# Patient Record
Sex: Female | Born: 1985 | Race: White | Hispanic: No | Marital: Married | Smoking: Former smoker
Health system: Southern US, Community
[De-identification: ages and names within clinical notes are randomized; demographics above are authoritative.]

## PROBLEM LIST (undated history)

## (undated) DIAGNOSIS — N10 Acute tubulo-interstitial nephritis: Secondary | ICD-10-CM

## (undated) DIAGNOSIS — R109 Unspecified abdominal pain: Secondary | ICD-10-CM

## (undated) DIAGNOSIS — N1330 Unspecified hydronephrosis: Secondary | ICD-10-CM

## (undated) DIAGNOSIS — N920 Excessive and frequent menstruation with regular cycle: Secondary | ICD-10-CM

## (undated) DIAGNOSIS — F329 Major depressive disorder, single episode, unspecified: Secondary | ICD-10-CM

## (undated) HISTORY — PX: WISDOM TOOTH EXTRACTION: SHX21

## (undated) HISTORY — DX: Excessive and frequent menstruation with regular cycle: N92.0

## (undated) HISTORY — DX: Unspecified abdominal pain: R10.9

## (undated) HISTORY — DX: Acute pyelonephritis: N10

## (undated) HISTORY — DX: Major depressive disorder, single episode, unspecified: F32.9

## (undated) HISTORY — PX: APPENDECTOMY: SHX54

## (undated) HISTORY — DX: Unspecified hydronephrosis: N13.30

---
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
## 2005-01-17 ENCOUNTER — Ambulatory Visit: Payer: Self-pay | Admitting: Family Medicine

## 2005-02-04 ENCOUNTER — Ambulatory Visit: Payer: Self-pay | Admitting: Family Medicine

## 2005-02-26 ENCOUNTER — Ambulatory Visit: Payer: Self-pay | Admitting: Family Medicine

## 2005-05-15 ENCOUNTER — Ambulatory Visit: Payer: Self-pay | Admitting: Family Medicine

## 2005-08-09 ENCOUNTER — Ambulatory Visit: Payer: Self-pay | Admitting: Family Medicine

## 2005-08-19 ENCOUNTER — Ambulatory Visit: Payer: Self-pay | Admitting: Family Medicine

## 2005-09-10 ENCOUNTER — Ambulatory Visit: Payer: Self-pay | Admitting: Family Medicine

## 2005-09-12 ENCOUNTER — Ambulatory Visit: Payer: Self-pay | Admitting: Family Medicine

## 2005-11-20 ENCOUNTER — Ambulatory Visit: Payer: Self-pay | Admitting: Family Medicine

## 2005-12-17 DIAGNOSIS — N10 Acute tubulo-interstitial nephritis: Secondary | ICD-10-CM | POA: Insufficient documentation

## 2005-12-17 HISTORY — DX: Acute pyelonephritis: N10

## 2006-01-24 ENCOUNTER — Encounter: Payer: Self-pay | Admitting: Family Medicine

## 2006-04-18 ENCOUNTER — Other Ambulatory Visit: Admission: RE | Admit: 2006-04-18 | Discharge: 2006-04-18 | Payer: Self-pay | Admitting: Family Medicine

## 2006-04-18 ENCOUNTER — Encounter: Payer: Self-pay | Admitting: Family Medicine

## 2006-04-18 ENCOUNTER — Encounter (INDEPENDENT_AMBULATORY_CARE_PROVIDER_SITE_OTHER): Payer: Self-pay | Admitting: *Deleted

## 2006-04-18 ENCOUNTER — Ambulatory Visit: Payer: Self-pay | Admitting: Family Medicine

## 2006-04-18 DIAGNOSIS — R109 Unspecified abdominal pain: Secondary | ICD-10-CM

## 2006-04-18 HISTORY — DX: Unspecified abdominal pain: R10.9

## 2006-04-18 LAB — CONVERTED CEMR LAB
Bilirubin Urine: NEGATIVE
Glucose, Urine, Semiquant: NEGATIVE
Urobilinogen, UA: NEGATIVE

## 2006-04-23 ENCOUNTER — Telehealth: Payer: Self-pay | Admitting: Family Medicine

## 2006-08-01 ENCOUNTER — Ambulatory Visit: Payer: Self-pay | Admitting: Family Medicine

## 2006-08-01 DIAGNOSIS — R42 Dizziness and giddiness: Secondary | ICD-10-CM | POA: Insufficient documentation

## 2006-08-01 DIAGNOSIS — R55 Syncope and collapse: Secondary | ICD-10-CM

## 2006-08-01 LAB — CONVERTED CEMR LAB
Blood Glucose, Fasting: 96 mg/dL
Hgb A1c MFr Bld: 5.3 %

## 2006-08-05 LAB — CONVERTED CEMR LAB
ALT: 10 units/L (ref 0–35)
AST: 16 units/L (ref 0–37)
Albumin: 4.6 g/dL (ref 3.5–5.2)
BUN: 11 mg/dL (ref 6–23)
Calcium: 9.4 mg/dL (ref 8.4–10.5)
Chloride: 108 meq/L (ref 96–112)
Hemoglobin: 13.1 g/dL (ref 12.0–15.0)
Potassium: 4.1 meq/L (ref 3.5–5.3)
RBC: 4.31 M/uL (ref 3.87–5.11)
RDW: 13.2 % (ref 11.5–14.0)

## 2006-09-22 ENCOUNTER — Ambulatory Visit: Payer: Self-pay | Admitting: Family Medicine

## 2006-09-22 ENCOUNTER — Encounter: Admission: RE | Admit: 2006-09-22 | Discharge: 2006-09-22 | Payer: Self-pay | Admitting: Family Medicine

## 2006-09-22 DIAGNOSIS — N63 Unspecified lump in unspecified breast: Secondary | ICD-10-CM | POA: Insufficient documentation

## 2006-10-23 ENCOUNTER — Ambulatory Visit: Payer: Self-pay | Admitting: Family Medicine

## 2006-10-23 DIAGNOSIS — N949 Unspecified condition associated with female genital organs and menstrual cycle: Secondary | ICD-10-CM

## 2007-07-14 ENCOUNTER — Encounter: Payer: Self-pay | Admitting: Family Medicine

## 2007-07-14 ENCOUNTER — Other Ambulatory Visit: Admission: RE | Admit: 2007-07-14 | Discharge: 2007-07-14 | Payer: Self-pay | Admitting: Family Medicine

## 2007-07-14 ENCOUNTER — Ambulatory Visit: Payer: Self-pay | Admitting: Family Medicine

## 2007-07-14 DIAGNOSIS — N760 Acute vaginitis: Secondary | ICD-10-CM | POA: Insufficient documentation

## 2007-07-15 ENCOUNTER — Encounter: Payer: Self-pay | Admitting: Family Medicine

## 2007-07-15 LAB — CONVERTED CEMR LAB: Yeast Wet Prep HPF POC: NONE SEEN

## 2007-09-30 ENCOUNTER — Telehealth: Payer: Self-pay | Admitting: Family Medicine

## 2007-10-15 ENCOUNTER — Ambulatory Visit: Payer: Self-pay | Admitting: Family Medicine

## 2007-10-15 DIAGNOSIS — F329 Major depressive disorder, single episode, unspecified: Secondary | ICD-10-CM

## 2007-10-15 DIAGNOSIS — F3289 Other specified depressive episodes: Secondary | ICD-10-CM

## 2007-10-15 HISTORY — DX: Major depressive disorder, single episode, unspecified: F32.9

## 2007-10-15 HISTORY — DX: Other specified depressive episodes: F32.89

## 2007-11-17 ENCOUNTER — Ambulatory Visit: Payer: Self-pay | Admitting: Family Medicine

## 2007-11-17 DIAGNOSIS — T23139A Burn of first degree of unspecified multiple fingers (nail), not including thumb, initial encounter: Secondary | ICD-10-CM

## 2008-02-01 ENCOUNTER — Ambulatory Visit: Payer: Self-pay | Admitting: Family Medicine

## 2008-02-19 ENCOUNTER — Ambulatory Visit: Payer: Self-pay | Admitting: Family Medicine

## 2008-02-19 DIAGNOSIS — M6740 Ganglion, unspecified site: Secondary | ICD-10-CM | POA: Insufficient documentation

## 2008-03-22 ENCOUNTER — Ambulatory Visit: Payer: Self-pay | Admitting: Family Medicine

## 2008-09-05 ENCOUNTER — Ambulatory Visit: Payer: Self-pay | Admitting: Occupational Medicine

## 2008-09-05 LAB — CONVERTED CEMR LAB
Beta hcg, urine, semiquantitative: NEGATIVE
Bilirubin Urine: NEGATIVE
Glucose, Urine, Semiquant: NEGATIVE
Protein, U semiquant: 100
pH: 7.5

## 2008-12-01 ENCOUNTER — Ambulatory Visit: Payer: Self-pay | Admitting: Family Medicine

## 2008-12-01 DIAGNOSIS — J019 Acute sinusitis, unspecified: Secondary | ICD-10-CM

## 2009-10-26 ENCOUNTER — Ambulatory Visit: Payer: Self-pay | Admitting: Family Medicine

## 2009-10-26 DIAGNOSIS — F411 Generalized anxiety disorder: Secondary | ICD-10-CM | POA: Insufficient documentation

## 2009-10-26 DIAGNOSIS — N920 Excessive and frequent menstruation with regular cycle: Secondary | ICD-10-CM

## 2009-10-26 HISTORY — DX: Excessive and frequent menstruation with regular cycle: N92.0

## 2009-10-27 ENCOUNTER — Encounter: Payer: Self-pay | Admitting: Family Medicine

## 2009-10-27 LAB — CONVERTED CEMR LAB
Chlamydia, DNA Probe: NEGATIVE
GC Probe Amp, Genital: NEGATIVE

## 2009-11-02 ENCOUNTER — Encounter: Payer: Self-pay | Admitting: Family Medicine

## 2010-04-12 NOTE — Assessment & Plan Note (Signed)
Summary: Followup Call  Followup call to patient [phone]);  no response, left message.  Recommend that she return for follow-up to see me on 11/06/09 or 11/07/09. Donna Christen MD  November 02, 2009 7:09 PM

## 2010-04-12 NOTE — Assessment & Plan Note (Signed)
Summary: ABNORMAL MENSTRAL BLEEDING (room5)   Vital Signs:  Patient Profile:   26 Years Old Female CC:      Heavy menses x 9 days, usually 3 day period LMP:     10/18/2009 Height:     65.5 inches Weight:      148 pounds O2 Sat:      100 % O2 treatment:    Room Air Temp:     98.8 degrees F oral Pulse rate:   72 / minute Pulse rhythm:   regular Resp:     12 per minute BP sitting:   113 / 76  (left arm) Cuff size:   regular  Vitals Entered By: Emilio Math (October 26, 2009 1:06 PM)  Menstrual History: LMP (date): 10/18/2009 LMP - Character: heavy                  Current Allergies (reviewed today): No known allergies History of Present Illness Chief Complaint: Heavy menses x 9 days, usually 3 day period History of Present Illness: Subjective:  Patient complains of two problems: 1)  Her present menstrual period has how lasted 9 days (normally about 3 days).  She denies abdominal pain but is continuing to have abdominal cramps.  Present period was on time.  She has had no preceding vaginal discharge.  No urinary symptoms.  No nausea/vomiting.  No fevers, chills, and sweats.  She is not presently using birth control of any type. 2)  She complains of increased stress for about 4 months.  She has been irritable and short-tempered.  She has difficulty falling asleep and complains of early morning awakening.  She has been crying more frequently but does not feel depressed.  No suicidal thoughts.  In the past for similar symptoms she had poor response from fluoxetine, sertraline, paroxetine, and bupropion.  REVIEW OF SYSTEMS Constitutional Symptoms      Denies fever, chills, night sweats, weight loss, weight gain, and fatigue.  Eyes       Denies change in vision, eye pain, eye discharge, glasses, contact lenses, and eye surgery. Ear/Nose/Throat/Mouth       Denies hearing loss/aids, change in hearing, ear pain, ear discharge, dizziness, frequent runny nose, frequent nose bleeds,  sinus problems, sore throat, hoarseness, and tooth pain or bleeding.  Respiratory       Denies dry cough, productive cough, wheezing, shortness of breath, asthma, bronchitis, and emphysema/COPD.  Cardiovascular       Complains of tires easily with exhertion.      Denies murmurs and chest pain.    Gastrointestinal       Complains of stomach pain.      Denies nausea/vomiting, diarrhea, constipation, blood in bowel movements, and indigestion. Genitourniary       Complains of blood or discharge from vagina.      Denies painful urination, kidney stones, and loss of urinary control. Neurological       Complains of headaches.      Denies paralysis, seizures, and fainting/blackouts. Musculoskeletal       Denies muscle pain, joint pain, joint stiffness, decreased range of motion, redness, swelling, muscle weakness, and gout.  Skin       Denies bruising, unusual mles/lumps or sores, and hair/skin or nail changes.  Psych       Complains of mood changes and anxiety/stress.      Denies temper/anger issues, speech problems, depression, and sleep problems.  Past History:  Past Medical History: Reviewed history from 01/24/2006 and no changes  required. G1P1001 NSVD term female  genital molluscum contagiosum  hospitalized for Pyelonephritis 6-07  Past Surgical History: Reviewed history from 09/05/2008 and no changes required. Denies surgical history  Family History: Reviewed history from 12/17/2005 and no changes required. mother father 2 brothers alive and healthy  Social History: Reviewed history from 12/17/2005 and no changes required. Graduated from HS.  Lives with boyfriend (FOB) and infant son.  Parents in the area.  Smokes 1/2 ppd x 3 yrs.  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 Pharmacy Tech   Objective:  Appearance:  Patient appears healthy, stated age, and in no acute distress  Psychiatric:  Patient is alert and oriented with good eye contact.  Thoughts are organized.  No psychomotor  retardation.  Good eye contact.  Memory intact.  Not suicidal.  Euthymic mood and affect Eyes:  Pupils are equal, round, and reactive to light and accomdation.  Extraocular movement is intact.  Conjunctivae are not inflamed.  Pharynx:  Normal  Neck:  Supple.  No adenopathy is present.  No thyromegaly is present  Lungs:  Clear to auscultation.  Breath sounds are equal.  Heart:  Regular rate and rhythm without murmurs, rubs, or gallops.  Abdomen:  Nontender without masses or hepatosplenomegaly.  Bowel sounds are present.  No CVA or flank tenderness.  Genitourinary:  Vulva appears normal without lesions or erythema.  Vagina has normal  mucosae without lesions and there is no discharge present in the vaginal vault.  Cervix appears normal without lesions.  There is menstrual blood in the cervical os.   No cervical motion tenderness present.    Uterus is small and nontender.  Adnexae are nontender without massess.  Wet prep and KOH prep negative urinalysis (dipstick): negative Urine pregnancy test negative Assessment New Problems: ANXIETY (ICD-300.00) MENORRHAGIA (ICD-626.2)  SUSPECT ANOVULATORY CYCLE  Plan New Medications/Changes: BUSPIRONE HCL 5 MG TABS (BUSPIRONE HCL) One by mouth two times a day  #30 x 0, 10/26/2009, Donna Christen MD NAPROXEN 500 MG TABS (NAPROXEN) One by mouth two times a day pc  #14 x 1, 10/26/2009, Donna Christen MD VENLAFAXINE HCL 25 MG TABS (VENLAFAXINE HCL) One by mouth two times a day  #30 x 0, 10/26/2009, Donna Christen MD  New Orders: Est. Patient Level III [16109] Urinalysis [81003-65000] Urine Pregnancy [CPT-81025] Burnett Corrente [facility] [[id number]] Planning Comments:   Reassurance.  Begin Naproxen 500mg  Q12hr for 3 to  5 days, then discontinue.  If menorrhagia persists, follow-up with GYN. Begin a trial of buspirone 5mg  two times a day (Rx #30).  Return in 10 to 14 days for follow-up (titrate dose if necessary); will then need to follow-up with her PCP for continued  management.   Return for worsening symptoms   The patient and/or caregiver has been counseled thoroughly with regard to medications prescribed including dosage, schedule, interactions, rationale for use, and possible side effects and they verbalize understanding.  Diagnoses and expected course of recovery discussed and will return if not improved as expected or if the condition worsens. Patient and/or caregiver verbalized understanding.  Prescriptions: BUSPIRONE HCL 5 MG TABS (BUSPIRONE HCL) One by mouth two times a day  #30 x 0   Entered and Authorized by:   Donna Christen MD   Signed by:   Donna Christen MD on 10/26/2009   Method used:   Print then Give to Patient   RxID:   [id number] NAPROXEN 500 MG TABS (NAPROXEN) One by mouth two times a day pc  #14 x 1  Entered and Authorized by:   Donna Christen MD   Signed by:   Donna Christen MD on 10/26/2009   Method used:   Print then Give to Patient   RxID:   [id number] VENLAFAXINE HCL 25 MG TABS (VENLAFAXINE HCL) One by mouth two times a day  #30 x 0   Entered and Authorized by:   Donna Christen MD   Signed by:   Donna Christen MD on 10/26/2009   Method used:   Print then Give to Patient   RxID:   [id number]   Orders Added: 1)  Est. Patient Level III [84132] 2)  Urinalysis [81003-65000] 3)  Urine Pregnancy [CPT-81025] 4)  KOH/ WET [place] [[id number]]  Appended Document: ABNORMAL MENSTRAL BLEEDING (room5) UA: GLU: Neg BIL: Neg KET: Neg SG 1.025 BLO 2+ pH 6.0 PRO Neg URO: 0.2 NIT: Neg LEU: Neg  Pregnancy: Neg

## 2010-04-12 NOTE — Assessment & Plan Note (Signed)
Summary: GC/Chlamydia  Plan:   Additional testing for today's office visit:  GC and chlamydia sent. Donna Christen MD  October 26, 2009 3:11 PM

## 2012-01-03 ENCOUNTER — Ambulatory Visit (INDEPENDENT_AMBULATORY_CARE_PROVIDER_SITE_OTHER): Payer: Self-pay | Admitting: Family Medicine

## 2012-01-03 ENCOUNTER — Encounter: Payer: Self-pay | Admitting: Family Medicine

## 2012-01-03 VITALS — BP 115/52 | HR 68 | Temp 98.1°F | Wt 168.0 lb

## 2012-01-03 DIAGNOSIS — R300 Dysuria: Secondary | ICD-10-CM

## 2012-01-03 DIAGNOSIS — N390 Urinary tract infection, site not specified: Secondary | ICD-10-CM

## 2012-01-03 LAB — POCT URINALYSIS DIPSTICK
Nitrite, UA: NEGATIVE
Protein, UA: NEGATIVE
pH, UA: 5.5

## 2012-01-03 MED ORDER — CIPROFLOXACIN HCL 250 MG PO TABS: ORAL_TABLET | ORAL | Status: AC

## 2012-01-03 NOTE — Progress Notes (Signed)
CC: Caitlin Jordan is a 26 y.o. female is here for Urinary Tract Infection   Subjective: HPI:  Patient tells me she woke up this morning with an extreme sense of urinary urgency, when she attempted to urinate there was a burning sensation near her urethra and she only had a small amount of urine to pass. She's had these symptoms since this morning, they're not getting any better or any worse, she is able to pass urine but is always in small volumes. She says this feels identical to past urinary tract infections however is been over a year since she's had her last one. She denies change in color or odor or consistency of her urine. She denies any other genitourinary complaints. She denies fevers, chills, nausea, vomiting, abdominal pain, GI disturbance, nor flank pain. She has no history of kidney stones. She tells me she was a urology office last week for followup of hydronephrosis in her recent pregnancy and she was told the urinalysis looked great and that a x-ray of her kidneys showed no abnormality.   Review Of Systems Outlined In HPI  Past Medical History  Diagnosis Date  . PELVIC  PAIN 04/18/2006    Qualifier: Diagnosis of  By: Linford Arnold MD, Santina Evans    . MENORRHAGIA 10/26/2009    Qualifier: Diagnosis of  By: Cathren Harsh MD, Jeannett Senior    . PYELONEPHRITIS, ACUTE 12/17/2005    Qualifier: Diagnosis of  By: Jerrol Banana    . DEPRESSION 10/15/2007    Qualifier: Diagnosis of  By: Linford Arnold MD, Santina Evans       History reviewed. No pertinent family history.   History  Substance Use Topics  . Smoking status: Former Smoker    Types: Cigarettes    Quit date: 11/03/2011  . Smokeless tobacco: Not on file  . Alcohol Use: 1.5 - 3.0 oz/week    3-6 drink(s) per week     Objective: Filed Vitals:   01/03/12 0925  BP: 115/52  Pulse: 68  Temp: 98.1 F (36.7 C)    General: Alert and Oriented, No Acute Distress HEENT: Pupils equal, round, reactive to light. Conjunctivae clear.  Moist mucous  membranes Lungs: Comfortable work of breathing  Cardiac: Regular rate and rhythm  Abdomen: Soft nontender  Back: No CVA tenderness   Assessment & Plan: Zamyia was seen today for urinary tract infection.  Diagnoses and associated orders for this visit:  Dysuria - Urinalysis Dipstick  Uti (lower urinary tract infection) - Urine culture - ciprofloxacin (CIPRO) 250 MG tablet; Take one by mouth twice a day for five days.    Discussed local sensitivities to Bactrim and Cipro, she has had success with Cipro in the past and preferred this regimen.Signs and symptoms requring emergent/urgent reevaluation were discussed with the patient.  Return in about 4 weeks (around 01/31/2012) for Establish care with Dr. Judie Petit.

## 2012-01-29 ENCOUNTER — Ambulatory Visit (INDEPENDENT_AMBULATORY_CARE_PROVIDER_SITE_OTHER): Payer: Self-pay | Admitting: Family Medicine

## 2012-01-29 VITALS — Temp 98.0°F

## 2012-01-29 DIAGNOSIS — Z23 Encounter for immunization: Secondary | ICD-10-CM

## 2012-01-29 NOTE — Progress Notes (Signed)
  Subjective:    Patient ID: Caitlin Jordan, female    DOB: 1986/01/11, 26 y.o.   MRN: [id number]  HPI Flu shot   Review of Systems     Objective:   Physical Exam        Assessment & Plan:

## 2012-03-11 NOTE — L&D Delivery Note (Signed)
Delivery Note Admitted at [redacted]w[redacted]d in active labor. Pt adamant to have a waterbirth despite being informed multiple times previously, and again today by me, that waterbirth is contraindicated in preterm infants and that we strongly recommend not having a waterbirth due to potential complications related to prematurity. Pt understands risks and desired to proceed with waterbirth. She progressed quickly and at 11:13 AM a viable female was delivered via Vaginal, Spontaneous Delivery (Presentation:Right Occiput Anterior) underwater, and was brought directly above surface of water and placed skin-to-skin against mom's chest. Infant w/ spontaneous cry/respiration, strong lusty cry w/ stimulation. Cord clamped x 2 and cut by fob at ~87mins after birth, and infant taken to radiant warmer to dry off while mom got out of tub to birth placenta.  APGAR: 7, 7; weight 8 lb 11.2 oz (3945 g).   Placenta status: Intact, Spontaneous, duncan.  Cord: 3 vessels with the following complications: None.    At ~ of life while skin-to-skin on mom's chest while she was in bed, O2 sats were low and infant was taken to nursery for further evaluation.  Anesthesia: None  Episiotomy: None Lacerations: Labial Suture Repair: n/a Est. Blood Loss (mL): 350 initially, no prophylactic pitocin given initially, then pt had large gush of blood- 20mu pitocin given IM w/ quick resolution of bleeding. ~30 mins later ~7cm clot expressed. Total EBL . Pt asymptomatic, VS stable.   Mom to postpartum.  Baby to nursery for further observation, accompanied by RN and fob.  Marge Duncans 11/21/2012, 11:58 AM

## 2012-03-11 NOTE — L&D Delivery Note (Signed)
Attestation of Attending Supervision of Advanced Practitioner (PA/CNM/NP): Evaluation and management procedures were performed by the Advanced Practitioner under my supervision and collaboration.  I have reviewed the Advanced Practitioner's note and chart, and I agree with the management and plan.  Zenya Hickam, MD, FACOG Attending Obstetrician & Gynecologist Faculty Practice, [facility]  

## 2012-06-11 ENCOUNTER — Encounter (HOSPITAL_COMMUNITY): Payer: Self-pay | Admitting: Advanced Practice Midwife

## 2012-06-11 ENCOUNTER — Ambulatory Visit (INDEPENDENT_AMBULATORY_CARE_PROVIDER_SITE_OTHER): Payer: BC Managed Care – PPO | Admitting: Advanced Practice Midwife

## 2012-06-11 ENCOUNTER — Encounter: Payer: Self-pay | Admitting: Advanced Practice Midwife

## 2012-06-11 VITALS — BP 136/69 | Wt 172.0 lb

## 2012-06-11 DIAGNOSIS — Z3480 Encounter for supervision of other normal pregnancy, unspecified trimester: Secondary | ICD-10-CM | POA: Insufficient documentation

## 2012-06-11 DIAGNOSIS — Z3481 Encounter for supervision of other normal pregnancy, first trimester: Secondary | ICD-10-CM

## 2012-06-11 NOTE — Progress Notes (Signed)
p-76  Last pap smear post partum of last child and denies any abnormals

## 2012-06-11 NOTE — Patient Instructions (Signed)
Pregnancy - Second Trimester The second trimester of pregnancy (3 to 6 months) is a period of rapid growth for you and your baby. At the end of the sixth month, your baby is about 9 inches long and weighs 1 1/2 pounds. You will begin to feel the baby move between 18 and 20 weeks of the pregnancy. This is called quickening. Weight gain is faster. A clear fluid (colostrum) may leak out of your breasts. You may feel small contractions of the womb (uterus). This is known as false labor or Braxton-Hicks contractions. This is like a practice for labor when the baby is ready to be born. Usually, the problems with morning sickness have usually passed by the end of your first trimester. Some women develop small dark blotches (called cholasma, mask of pregnancy) on their face that usually goes away after the baby is born. Exposure to the sun makes the blotches worse. Acne may also develop in some pregnant women and pregnant women who have acne, may find that it goes away. PRENATAL EXAMS  Blood work may continue to be done during prenatal exams. These tests are done to check on your health and the probable health of your baby. Blood work is used to follow your blood levels (hemoglobin). Anemia (low hemoglobin) is common during pregnancy. Iron and vitamins are given to help prevent this. You will also be checked for diabetes between 24 and 28 weeks of the pregnancy. Some of the previous blood tests may be repeated.  The size of the uterus is measured during each visit. This is to make sure that the baby is continuing to grow properly according to the dates of the pregnancy.  Your blood pressure is checked every prenatal visit. This is to make sure you are not getting toxemia.  Your urine is checked to make sure you do not have an infection, diabetes or protein in the urine.  Your weight is checked often to make sure gains are happening at the suggested rate. This is to ensure that both you and your baby are growing  normally.  Sometimes, an ultrasound is performed to confirm the proper growth and development of the baby. This is a test which bounces harmless sound waves off the baby so your caregiver can more accurately determine due dates. Sometimes, a specialized test is done on the amniotic fluid surrounding the baby. This test is called an amniocentesis. The amniotic fluid is obtained by sticking a needle into the belly (abdomen). This is done to check the chromosomes in instances where there is a concern about possible genetic problems with the baby. It is also sometimes done near the end of pregnancy if an early delivery is required. In this case, it is done to help make sure the baby's lungs are mature enough for the baby to live outside of the womb. CHANGES OCCURING IN THE SECOND TRIMESTER OF PREGNANCY Your body goes through many changes during pregnancy. They vary from person to person. Talk to your caregiver about changes you notice that you are concerned about.  During the second trimester, you will likely have an increase in your appetite. It is normal to have cravings for certain foods. This varies from person to person and pregnancy to pregnancy.  Your lower abdomen will begin to bulge.  You may have to urinate more often because the uterus and baby are pressing on your bladder. It is also common to get more bladder infections during pregnancy (pain with urination). You can help this by   drinking lots of fluids and emptying your bladder before and after intercourse.  You may begin to get stretch marks on your hips, abdomen, and breasts. These are normal changes in the body during pregnancy. There are no exercises or medications to take that prevent this change.  You may begin to develop swollen and bulging veins (varicose veins) in your legs. Wearing support hose, elevating your feet for 15 minutes, 3 to 4 times a day and limiting salt in your diet helps lessen the problem.  Heartburn may develop  as the uterus grows and pushes up against the stomach. Antacids recommended by your caregiver helps with this problem. Also, eating smaller meals 4 to 5 times a day helps.  Constipation can be treated with a stool softener or adding bulk to your diet. Drinking lots of fluids, vegetables, fruits, and whole grains are helpful.  Exercising is also helpful. If you have been very active up until your pregnancy, most of these activities can be continued during your pregnancy. If you have been less active, it is helpful to start an exercise program such as walking.  Hemorrhoids (varicose veins in the rectum) may develop at the end of the second trimester. Warm sitz baths and hemorrhoid cream recommended by your caregiver helps hemorrhoid problems.  Backaches may develop during this time of your pregnancy. Avoid heavy lifting, wear low heal shoes and practice good posture to help with backache problems.  Some pregnant women develop tingling and numbness of their hand and fingers because of swelling and tightening of ligaments in the wrist (carpel tunnel syndrome). This goes away after the baby is born.  As your breasts enlarge, you may have to get a bigger bra. Get a comfortable, cotton, support bra. Do not get a nursing bra until the last month of the pregnancy if you will be nursing the baby.  You may get a dark line from your belly button to the pubic area called the linea nigra.  You may develop rosy cheeks because of increase blood flow to the face.  You may develop spider looking lines of the face, neck, arms and chest. These go away after the baby is born. HOME CARE INSTRUCTIONS   It is extremely important to avoid all smoking, herbs, alcohol, and unprescribed drugs during your pregnancy. These chemicals affect the formation and growth of the baby. Avoid these chemicals throughout the pregnancy to ensure the delivery of a healthy infant.  Most of your home care instructions are the same as  suggested for the first trimester of your pregnancy. Keep your caregiver's appointments. Follow your caregiver's instructions regarding medication use, exercise and diet.  During pregnancy, you are providing food for you and your baby. Continue to eat regular, well-balanced meals. Choose foods such as meat, fish, milk and other low fat dairy products, vegetables, fruits, and whole-grain breads and cereals. Your caregiver will tell you of the ideal weight gain.  A physical sexual relationship may be continued up until near the end of pregnancy if there are no other problems. Problems could include early (premature) leaking of amniotic fluid from the membranes, vaginal bleeding, abdominal pain, or other medical or pregnancy problems.  Exercise regularly if there are no restrictions. Check with your caregiver if you are unsure of the safety of some of your exercises. The greatest weight gain will occur in the last 2 trimesters of pregnancy. Exercise will help you:  Control your weight.  Get you in shape for labor and delivery.  Lose weight   after you have the baby.  Wear a good support or jogging bra for breast tenderness during pregnancy. This may help if worn during sleep. Pads or tissues may be used in the bra if you are leaking colostrum.  Do not use hot tubs, steam rooms or saunas throughout the pregnancy.  Wear your seat belt at all times when driving. This protects you and your baby if you are in an accident.  Avoid raw meat, uncooked cheese, cat litter boxes and soil used by cats. These carry germs that can cause birth defects in the baby.  The second trimester is also a good time to visit your dentist for your dental health if this has not been done yet. Getting your teeth cleaned is OK. Use a soft toothbrush. Brush gently during pregnancy.  It is easier to loose urine during pregnancy. Tightening up and strengthening the pelvic muscles will help with this problem. Practice stopping your  urination while you are going to the bathroom. These are the same muscles you need to strengthen. It is also the muscles you would use as if you were trying to stop from passing gas. You can practice tightening these muscles up 10 times a set and repeating this about 3 times per day. Once you know what muscles to tighten up, do not perform these exercises during urination. It is more likely to contribute to an infection by backing up the urine.  Ask for help if you have financial, counseling or nutritional needs during pregnancy. Your caregiver will be able to offer counseling for these needs as well as refer you for other special needs.  Your skin may become oily. If so, wash your face with mild soap, use non-greasy moisturizer and oil or cream based makeup. MEDICATIONS AND DRUG USE IN PREGNANCY  Take prenatal vitamins as directed. The vitamin should contain 1 milligram of folic acid. Keep all vitamins out of reach of children. Only a couple vitamins or tablets containing iron may be fatal to a baby or young child when ingested.  Avoid use of all medications, including herbs, over-the-counter medications, not prescribed or suggested by your caregiver. Only take over-the-counter or prescription medicines for pain, discomfort, or fever as directed by your caregiver. Do not use aspirin.  Let your caregiver also know about herbs you may be using.  Alcohol is related to a number of birth defects. This includes fetal alcohol syndrome. All alcohol, in any form, should be avoided completely. Smoking will cause low birth rate and premature babies.  Street or illegal drugs are very harmful to the baby. They are absolutely forbidden. A baby born to an addicted mother will be addicted at birth. The baby will go through the same withdrawal an adult does. SEEK MEDICAL CARE IF:  You have any concerns or worries during your pregnancy. It is better to call with your questions if you feel they cannot wait, rather  than worry about them. SEEK IMMEDIATE MEDICAL CARE IF:   An unexplained oral temperature above 102 F (38.9 C) develops, or as your caregiver suggests.  You have leaking of fluid from the vagina (birth canal). If leaking membranes are suspected, take your temperature and tell your caregiver of this when you call.  There is vaginal spotting, bleeding, or passing clots. Tell your caregiver of the amount and how many pads are used. Light spotting in pregnancy is common, especially following intercourse.  You develop a bad smelling vaginal discharge with a change in the color from clear   to white.  You continue to feel sick to your stomach (nauseated) and have no relief from remedies suggested. You vomit blood or coffee ground-like materials.  You lose more than 2 pounds of weight or gain more than 2 pounds of weight over 1 week, or as suggested by your caregiver.  You notice swelling of your face, hands, feet, or legs.  You get exposed to German measles and have never had them.  You are exposed to fifth disease or chickenpox.  You develop belly (abdominal) pain. Round ligament discomfort is a common non-cancerous (benign) cause of abdominal pain in pregnancy. Your caregiver still must evaluate you.  You develop a bad headache that does not go away.  You develop fever, diarrhea, pain with urination, or shortness of breath.  You develop visual problems, blurry, or double vision.  You fall or are in a car accident or any kind of trauma.  There is mental or physical violence at home. Document Released: 02/19/2001 Document Revised: 05/20/2011 Document Reviewed: 08/24/2008 [employer] Patient Information 2013 [employer].  

## 2012-06-11 NOTE — Progress Notes (Signed)
Subjective:    Caitlin Jordan is a [age] [redacted]w[redacted]d being seen today for her first obstetrical visit.  Her obstetrical history is significant for 2 previous vaginal deliveries, no complications other than epidurals did not work well per pt.  . Patient does intend to breast feed. Pregnancy history fully reviewed.  Pt plans waterbirth with this pregnancy  Patient reports nausea.  She does not require medication for this and reports improved symptoms in last couple of weeks.  Filed Vitals:   06/11/12 1411  BP: 136/69  Weight: 172 lb (78.019 kg)    HISTORY: OB History   Grav Para Term Preterm Abortions TAB SAB Ect Mult Living   3 2 2       2      # Outc Date GA Lbr Len/2nd Wgt Sex Del Anes PTL Lv   1 TRM 9/06 [redacted]w[redacted]d  8lb13oz(3.997kg) M SVD EPI No Yes   2 TRM 12/12 [redacted]w[redacted]d  9lb0.1oz(4.085kg) M SVD EPI No Yes   3 CUR              Past Medical History  Diagnosis Date  . PELVIC  PAIN 04/18/2006    Qualifier: Diagnosis of  By: Linford Arnold MD, Santina Evans    . MENORRHAGIA 10/26/2009    Qualifier: Diagnosis of  By: Cathren Harsh MD, Jeannett Senior    . PYELONEPHRITIS, ACUTE 12/17/2005    Qualifier: Diagnosis of  By: Jerrol Banana    . DEPRESSION 10/15/2007    Qualifier: Diagnosis of  By: Linford Arnold MD, Santina Evans    . Complication of anesthesia     epidural didn't work  . Hydronephrosis     after pregnancy no signs on xray   Past Surgical History  Procedure Laterality Date  . Wisdom tooth extraction     No family history on file.   Exam    Uterus:     Pelvic Exam:    Perineum: No Hemorrhoids   Vulva: normal, Bartholin's, Urethra, Skene's normal   Vagina:  normal mucosa, normal discharge   pH:    Cervix: no cervical motion tenderness   Adnexa: normal adnexa and no mass, fullness, tenderness   Bony Pelvis: gynecoid  System: Breast:  normal appearance, no masses or tenderness   Skin: normal coloration and turgor, no rashes    Neurologic: oriented, normal, normal mood, gait normal; reflexes normal  and symmetric   Extremities: normal strength, tone, and muscle mass, ROM of all joints is normal   HEENT neck supple with midline trachea and thyroid without masses   Mouth/Teeth mucous membranes moist, pharynx normal without lesions and dental hygiene good   Neck supple and no masses   Cardiovascular: regular rate and rhythm   Respiratory:  appears well, vitals normal, no respiratory distress, acyanotic, normal RR, ear and throat exam is normal, neck free of mass or lymphadenopathy, chest clear, no wheezing, crepitations, rhonchi, normal symmetric air entry   Abdomen: soft, non-tender; bowel sounds normal; no masses,  no organomegaly   Urinary: urethral meatus normal      Assessment:    Pregnancy: [id number] Patient Active Problem List  Diagnosis  . ANXIETY  . DEPRESSION  . SINUSITIS - ACUTE-NOS  . PYELONEPHRITIS, ACUTE  . LUMP OR MASS IN BREAST  . VAGINITIS  . MENORRHAGIA  . DELAYED MENSES  . GANGLION CYST  . SYMPTOM, SYNCOPE AND COLLAPSE  . DIZZINESS  . PELVIC  PAIN  . BURN, FIRST DEGREE, FINGERS        Plan:  Initial labs drawn. Prenatal vitamins. Problem list reviewed and updated. Genetic Screening discussed First Screen: ordered.  Ultrasound discussed; fetal survey: requested.  Follow up in 4 weeks. Pap not done at today's visit per pt, consider doing postpartum if pt desires. 50% of 30 min visit spent on counseling and coordination of care.     LEFTWICH-KIRBY, Anzlee Hinesley 06/11/2012

## 2012-06-12 LAB — GC/CHLAMYDIA PROBE AMP: GC Probe RNA: NEGATIVE

## 2012-06-12 LAB — OB RESULTS CONSOLE GC/CHLAMYDIA: Gonorrhea: NEGATIVE

## 2012-06-13 LAB — OBSTETRIC PANEL
Antibody Screen: NEGATIVE
Basophils Absolute: 0 10*3/uL (ref 0.0–0.1)
Eosinophils Absolute: 0.1 10*3/uL (ref 0.0–0.7)
Eosinophils Relative: 1 % (ref 0–5)
Lymphs Abs: 2.6 10*3/uL (ref 0.7–4.0)
MCH: 29.3 pg (ref 26.0–34.0)
MCV: 86.5 fL (ref 78.0–100.0)
Monocytes Absolute: 0.7 10*3/uL (ref 0.1–1.0)
Platelets: 355 10*3/uL (ref 150–400)
RDW: 13.2 % (ref 11.5–15.5)

## 2012-06-13 LAB — CULTURE, URINE COMPREHENSIVE
Colony Count: NO GROWTH
Organism ID, Bacteria: NO GROWTH

## 2012-06-17 ENCOUNTER — Ambulatory Visit (HOSPITAL_COMMUNITY): Payer: BC Managed Care – PPO

## 2012-06-18 ENCOUNTER — Other Ambulatory Visit: Payer: Self-pay | Admitting: Advanced Practice Midwife

## 2012-06-18 ENCOUNTER — Ambulatory Visit (HOSPITAL_COMMUNITY)
Admission: RE | Admit: 2012-06-18 | Discharge: 2012-06-18 | Disposition: A | Discharge status: Home / Self Care | Payer: BC Managed Care – PPO | Source: Ambulatory Visit | Attending: Advanced Practice Midwife | Admitting: Advanced Practice Midwife

## 2012-06-18 ENCOUNTER — Ambulatory Visit (HOSPITAL_COMMUNITY): Admission: RE | Admit: 2012-06-18 | Payer: BC Managed Care – PPO | Source: Ambulatory Visit

## 2012-06-18 DIAGNOSIS — O3510X0 Maternal care for (suspected) chromosomal abnormality in fetus, unspecified, not applicable or unspecified: Secondary | ICD-10-CM | POA: Insufficient documentation

## 2012-06-18 DIAGNOSIS — Z3689 Encounter for other specified antenatal screening: Secondary | ICD-10-CM | POA: Insufficient documentation

## 2012-06-18 DIAGNOSIS — O351XX0 Maternal care for (suspected) chromosomal abnormality in fetus, not applicable or unspecified: Secondary | ICD-10-CM | POA: Insufficient documentation

## 2012-06-22 ENCOUNTER — Ambulatory Visit (INDEPENDENT_AMBULATORY_CARE_PROVIDER_SITE_OTHER): Payer: BC Managed Care – PPO | Admitting: Physician Assistant

## 2012-06-22 ENCOUNTER — Encounter: Payer: Self-pay | Admitting: Physician Assistant

## 2012-06-22 VITALS — BP 114/68 | HR 76 | Temp 98.1°F | Wt 173.0 lb

## 2012-06-22 DIAGNOSIS — J309 Allergic rhinitis, unspecified: Secondary | ICD-10-CM

## 2012-06-22 DIAGNOSIS — J302 Other seasonal allergic rhinitis: Secondary | ICD-10-CM

## 2012-06-22 DIAGNOSIS — J020 Streptococcal pharyngitis: Secondary | ICD-10-CM

## 2012-06-22 DIAGNOSIS — J029 Acute pharyngitis, unspecified: Secondary | ICD-10-CM

## 2012-06-22 NOTE — Progress Notes (Signed)
  Subjective:    Patient ID: Caitlin Jordan, female    DOB: 09/13/1985, 27 y.o.   MRN: [id number]  HPI Patient presents to the clinic with 3-4 days of sinus pressure. She is 4 months pregnant. She has a lot of drainage and runny nose. She feels like she always has a tickle in her throat. Took sudafed and did help her sinus pressure. Cough is dry. Sore throat is 4/10. Denies any fever, n/v/d, ear pain. Worsened yesterday after working in the yard all day.    Review of Systems     Objective:   Physical Exam  Constitutional: She is oriented to person, place, and time. She appears well-developed and well-nourished.  Pregnant  HENT:  Head: Normocephalic and atraumatic.  Right Ear: External ear normal.  Left Ear: External ear normal.  TM's clear.   Oropharynx with post nasal drip and erythema. Bilateral turbinates red and swollen.   Eyes:  Watery bilateral discharge. Conjunctivia injected.   Neck: Normal range of motion. Neck supple. No thyromegaly present.  Cardiovascular: Normal rate, regular rhythm and normal heart sounds.   Pulmonary/Chest: Effort normal and breath sounds normal.  Lymphadenopathy:    She has no cervical adenopathy.  Neurological: She is alert and oriented to person, place, and time.  Skin: Skin is warm and dry.  Psychiatric: She has a normal mood and affect. Her behavior is normal.          Assessment & Plan:  Acute pharyngitis/Seasonal allergies- Rapid strep negative. Reassured patient that I felt like her symptoms were allergies. Start zyrtec daily. Tylenol/honey/gargle with salt water for ST. Sudafed for the next 2-4 days. Stay inside. Consider nettie pot. Call if not improving by Thursday or worsening.

## 2012-06-22 NOTE — Patient Instructions (Addendum)
Start a zyrtec/claritin/allegra take daily to get into system.    Sudafed for next 2-3.  Salt water. Warm honey.  Tylenol 500mg  three times a day.   Sore Throat Sore throats may be caused by bacteria and viruses. They may also be caused by:  Smoking.  Pollution.  Allergies. If a sore throat is due to strep infection (a bacterial infection), you may need:  A throat swab.  A culture test to verify the strep infection. You will need one of these:  An antibiotic shot.  Oral medicine for a full 10 days. Strep infection is very contagious. A doctor should check any close contacts who have a sore throat or fever. A sore throat caused by a virus infection will usually last only 3-4 days. Antibiotics will not treat a viral sore throat.  Infectious mononucleosis (a viral disease), however, can cause a sore throat that lasts for up to 3 weeks. Mononucleosis can be diagnosed with blood tests. You must have been sick for at least 1 week in order for the test to give accurate results. HOME CARE INSTRUCTIONS   To treat a sore throat, take mild pain medicine.  Increase your fluids.  Eat a soft diet.  Do not smoke.  Gargling with warm water or salt water (1 tsp. salt in 8 oz. water) can be helpful.  Try throat sprays or lozenges or sucking on hard candy to ease the symptoms. Call your doctor if your sore throat lasts longer than 1 week.  SEEK IMMEDIATE MEDICAL CARE IF:  You have difficulty breathing.  You have increased swelling in the throat.  You have pain so severe that you are unable to swallow fluids or your saliva.  You have a severe headache, a high fever, vomiting, or a red rash. Document Released: 04/04/2004 Document Revised: 05/20/2011 Document Reviewed: 02/12/2007 [facility] Patient Information 2013 [place], [place].

## 2012-06-23 ENCOUNTER — Ambulatory Visit (INDEPENDENT_AMBULATORY_CARE_PROVIDER_SITE_OTHER): Payer: BC Managed Care – PPO | Admitting: Family Medicine

## 2012-06-23 ENCOUNTER — Encounter: Payer: Self-pay | Admitting: Family Medicine

## 2012-06-23 VITALS — BP 108/63 | HR 85 | Wt 173.0 lb

## 2012-06-23 DIAGNOSIS — K648 Other hemorrhoids: Secondary | ICD-10-CM

## 2012-06-23 DIAGNOSIS — K625 Hemorrhage of anus and rectum: Secondary | ICD-10-CM

## 2012-06-23 MED ORDER — HYDROCORTISONE ACETATE 25 MG RE SUPP: 25.0000 mg | Freq: Two times a day (BID) | RECTAL | Status: DC

## 2012-06-23 NOTE — Progress Notes (Signed)
CC: Caitlin Jordan is a 27 y.o. female is here for Rectal Bleeding   Subjective: HPI:  Patient with blood on toilet paper and in toilet noticed Saturday.  She describes bright red blood streaking on the stool she's passing and when wiping her rectum. She tells me was 100% confidence this is not coming from her vagina. She tells me that the bleeding is only apparent with bowel movement or seconds afterwards. She describes having a considerable effort into straining to have a bowel movement. She is having a bowel movement every 2-3 days for the past weeks. She describes an irritation sensation in her rectum only occurring when defecating. She denies a history of personal or family colon cancer. She denies rectal discomfort at rest. She denies irregular heartbeat, palpitations, shortness of breath, fatigue. She denies past medical history bleeding issues. Has been taking docusate twice a day without improvement of straining to defecate  Review Of Systems Outlined In HPI  Past Medical History  Diagnosis Date  . PELVIC  PAIN 04/18/2006    Qualifier: Diagnosis of  By: Linford Arnold MD, Santina Evans    . MENORRHAGIA 10/26/2009    Qualifier: Diagnosis of  By: Cathren Harsh MD, Jeannett Senior    . PYELONEPHRITIS, ACUTE 12/17/2005    Qualifier: Diagnosis of  By: Jerrol Banana    . DEPRESSION 10/15/2007    Qualifier: Diagnosis of  By: Linford Arnold MD, Santina Evans    . Complication of anesthesia     epidural didn't work  . Hydronephrosis     after pregnancy no signs on xray     No family history on file.   History  Substance Use Topics  . Smoking status: Former Smoker -- 1.00 packs/day for 6 years    Types: Cigarettes    Quit date: 11/03/2011  . Smokeless tobacco: Not on file  . Alcohol Use: 1.5 - 3 oz/week    3-6 drink(s) per week     Objective: Filed Vitals:   06/23/12 1005  BP: 108/63  Pulse: 85    Vital signs reviewed. General: Alert and Oriented, No Acute Distress HEENT: Pupils equal, round, reactive to  light. Conjunctivae clear.  External ears unremarkable.  Moist mucous membranes. Lungs: Clear and comfortable work of breathing, speaking in full sentences without accessory muscle use. No wheezing rhonchi nor rales Cardiac: Regular rate and rhythm. No murmurs Neuro: CN II-XII grossly intact, gait normal. Extremities: No peripheral edema.  Strong peripheral pulses.  Mental Status: No depression, anxiety, nor agitation. Logical though process. Skin: Warm and dry.  Assessment & Plan: Fe was seen today for rectal bleeding.  Diagnoses and associated orders for this visit:  Rectal bleeding - POCT hemoglobin  Internal hemorrhoid - hydrocortisone (ANUSOL-HC) 25 MG suppository; Place 1 suppository (25 mg total) rectally every 12 (twelve) hours.  Other Orders - loratadine (CLARITIN) 10 MG tablet; Take 10 mg by mouth daily. - DOCUSATE SODIUM PO; Take by mouth. - pseudoephedrine (SUDAFED) 30 MG tablet; Take 30 mg by mouth every 4 (four) hours as needed for congestion.     rectal bleeding: Hemoglobin obtained showing a drop of 1.5 points since last week. Discussed with patient that symptoms highly suggestive of internal hemorrhoids given constipation and pregnancy and we can confirm with anoscopy today. She would prefer to hold off on that procedure. We'll treat with psyllum powder, three heaping teaspoons a day, docusate once-twice a day, with goal of painless lack of straining BMs.  Preparation H wipes and Anusol-HC to help with irritation. Return on  Thursday or Monday.Signs and symptoms requring emergent/urgent reevaluation were discussed with the patient.  Return in about 4 days (around 06/27/2012).

## 2012-06-23 NOTE — Patient Instructions (Addendum)
Psyllium powder: Three heaping teaspoons mixed in water daily to help prevent straining and bleeding.

## 2012-07-10 ENCOUNTER — Ambulatory Visit (INDEPENDENT_AMBULATORY_CARE_PROVIDER_SITE_OTHER): Payer: BC Managed Care – PPO | Admitting: Advanced Practice Midwife

## 2012-07-10 ENCOUNTER — Other Ambulatory Visit: Payer: Self-pay | Admitting: Advanced Practice Midwife

## 2012-07-10 ENCOUNTER — Encounter: Payer: Self-pay | Admitting: Advanced Practice Midwife

## 2012-07-10 VITALS — BP 115/61 | Temp 97.9°F | Wt 179.0 lb

## 2012-07-10 DIAGNOSIS — O09299 Supervision of pregnancy with other poor reproductive or obstetric history, unspecified trimester: Secondary | ICD-10-CM | POA: Insufficient documentation

## 2012-07-10 DIAGNOSIS — O09292 Supervision of pregnancy with other poor reproductive or obstetric history, second trimester: Secondary | ICD-10-CM

## 2012-07-10 DIAGNOSIS — K5900 Constipation, unspecified: Secondary | ICD-10-CM

## 2012-07-10 MED ORDER — POLYETHYLENE GLYCOL 3350 17 G PO PACK: 17.0000 g | PACK | Freq: Every day | ORAL | Status: DC

## 2012-07-10 NOTE — Patient Instructions (Addendum)
Pregnancy - Second Trimester The second trimester of pregnancy (3 to 6 months) is a period of rapid growth for you and your baby. At the end of the sixth month, your baby is about 9 inches long and weighs 1 1/2 pounds. You will begin to feel the baby move between 18 and 20 weeks of the pregnancy. This is called quickening. Weight gain is faster. A clear fluid (colostrum) may leak out of your breasts. You may feel small contractions of the womb (uterus). This is known as false labor or Braxton-Hicks contractions. This is like a practice for labor when the baby is ready to be born. Usually, the problems with morning sickness have usually passed by the end of your first trimester. Some women develop small dark blotches (called cholasma, mask of pregnancy) on their face that usually goes away after the baby is born. Exposure to the sun makes the blotches worse. Acne may also develop in some pregnant women and pregnant women who have acne, may find that it goes away. PRENATAL EXAMS  Blood work may continue to be done during prenatal exams. These tests are done to check on your health and the probable health of your baby. Blood work is used to follow your blood levels (hemoglobin). Anemia (low hemoglobin) is common during pregnancy. Iron and vitamins are given to help prevent this. You will also be checked for diabetes between 24 and 28 weeks of the pregnancy. Some of the previous blood tests may be repeated.  The size of the uterus is measured during each visit. This is to make sure that the baby is continuing to grow properly according to the dates of the pregnancy.  Your blood pressure is checked every prenatal visit. This is to make sure you are not getting toxemia.  Your urine is checked to make sure you do not have an infection, diabetes or protein in the urine.  Your weight is checked often to make sure gains are happening at the suggested rate. This is to ensure that both you and your baby are growing  normally.  Sometimes, an ultrasound is performed to confirm the proper growth and development of the baby. This is a test which bounces harmless sound waves off the baby so your caregiver can more accurately determine due dates. Sometimes, a specialized test is done on the amniotic fluid surrounding the baby. This test is called an amniocentesis. The amniotic fluid is obtained by sticking a needle into the belly (abdomen). This is done to check the chromosomes in instances where there is a concern about possible genetic problems with the baby. It is also sometimes done near the end of pregnancy if an early delivery is required. In this case, it is done to help make sure the baby's lungs are mature enough for the baby to live outside of the womb. CHANGES OCCURING IN THE SECOND TRIMESTER OF PREGNANCY Your body goes through many changes during pregnancy. They vary from person to person. Talk to your caregiver about changes you notice that you are concerned about.  During the second trimester, you will likely have an increase in your appetite. It is normal to have cravings for certain foods. This varies from person to person and pregnancy to pregnancy.  Your lower abdomen will begin to bulge.  You may have to urinate more often because the uterus and baby are pressing on your bladder. It is also common to get more bladder infections during pregnancy (pain with urination). You can help this by   drinking lots of fluids and emptying your bladder before and after intercourse.  You may begin to get stretch marks on your hips, abdomen, and breasts. These are normal changes in the body during pregnancy. There are no exercises or medications to take that prevent this change.  You may begin to develop swollen and bulging veins (varicose veins) in your legs. Wearing support hose, elevating your feet for 15 minutes, 3 to 4 times a day and limiting salt in your diet helps lessen the problem.  Heartburn may develop  as the uterus grows and pushes up against the stomach. Antacids recommended by your caregiver helps with this problem. Also, eating smaller meals 4 to 5 times a day helps.  Constipation can be treated with a stool softener or adding bulk to your diet. Drinking lots of fluids, vegetables, fruits, and whole grains are helpful.  Exercising is also helpful. If you have been very active up until your pregnancy, most of these activities can be continued during your pregnancy. If you have been less active, it is helpful to start an exercise program such as walking.  Hemorrhoids (varicose veins in the rectum) may develop at the end of the second trimester. Warm sitz baths and hemorrhoid cream recommended by your caregiver helps hemorrhoid problems.  Backaches may develop during this time of your pregnancy. Avoid heavy lifting, wear low heal shoes and practice good posture to help with backache problems.  Some pregnant women develop tingling and numbness of their hand and fingers because of swelling and tightening of ligaments in the wrist (carpel tunnel syndrome). This goes away after the baby is born.  As your breasts enlarge, you may have to get a bigger bra. Get a comfortable, cotton, support bra. Do not get a nursing bra until the last month of the pregnancy if you will be nursing the baby.  You may get a dark line from your belly button to the pubic area called the linea nigra.  You may develop rosy cheeks because of increase blood flow to the face.  You may develop spider looking lines of the face, neck, arms and chest. These go away after the baby is born. HOME CARE INSTRUCTIONS   It is extremely important to avoid all smoking, herbs, alcohol, and unprescribed drugs during your pregnancy. These chemicals affect the formation and growth of the baby. Avoid these chemicals throughout the pregnancy to ensure the delivery of a healthy infant.  Most of your home care instructions are the same as  suggested for the first trimester of your pregnancy. Keep your caregiver's appointments. Follow your caregiver's instructions regarding medication use, exercise and diet.  During pregnancy, you are providing food for you and your baby. Continue to eat regular, well-balanced meals. Choose foods such as meat, fish, milk and other low fat dairy products, vegetables, fruits, and whole-grain breads and cereals. Your caregiver will tell you of the ideal weight gain.  A physical sexual relationship may be continued up until near the end of pregnancy if there are no other problems. Problems could include early (premature) leaking of amniotic fluid from the membranes, vaginal bleeding, abdominal pain, or other medical or pregnancy problems.  Exercise regularly if there are no restrictions. Check with your caregiver if you are unsure of the safety of some of your exercises. The greatest weight gain will occur in the last 2 trimesters of pregnancy. Exercise will help you:  Control your weight.  Get you in shape for labor and delivery.  Lose weight   after you have the baby.  Wear a good support or jogging bra for breast tenderness during pregnancy. This may help if worn during sleep. Pads or tissues may be used in the bra if you are leaking colostrum.  Do not use hot tubs, steam rooms or saunas throughout the pregnancy.  Wear your seat belt at all times when driving. This protects you and your baby if you are in an accident.  Avoid raw meat, uncooked cheese, cat litter boxes and soil used by cats. These carry germs that can cause birth defects in the baby.  The second trimester is also a good time to visit your dentist for your dental health if this has not been done yet. Getting your teeth cleaned is OK. Use a soft toothbrush. Brush gently during pregnancy.  It is easier to loose urine during pregnancy. Tightening up and strengthening the pelvic muscles will help with this problem. Practice stopping your  urination while you are going to the bathroom. These are the same muscles you need to strengthen. It is also the muscles you would use as if you were trying to stop from passing gas. You can practice tightening these muscles up 10 times a set and repeating this about 3 times per day. Once you know what muscles to tighten up, do not perform these exercises during urination. It is more likely to contribute to an infection by backing up the urine.  Ask for help if you have financial, counseling or nutritional needs during pregnancy. Your caregiver will be able to offer counseling for these needs as well as refer you for other special needs.  Your skin may become oily. If so, wash your face with mild soap, use non-greasy moisturizer and oil or cream based makeup. MEDICATIONS AND DRUG USE IN PREGNANCY  Take prenatal vitamins as directed. The vitamin should contain 1 milligram of folic acid. Keep all vitamins out of reach of children. Only a couple vitamins or tablets containing iron may be fatal to a baby or young child when ingested.  Avoid use of all medications, including herbs, over-the-counter medications, not prescribed or suggested by your caregiver. Only take over-the-counter or prescription medicines for pain, discomfort, or fever as directed by your caregiver. Do not use aspirin.  Let your caregiver also know about herbs you may be using.  Alcohol is related to a number of birth defects. This includes fetal alcohol syndrome. All alcohol, in any form, should be avoided completely. Smoking will cause low birth rate and premature babies.  Street or illegal drugs are very harmful to the baby. They are absolutely forbidden. A baby born to an addicted mother will be addicted at birth. The baby will go through the same withdrawal an adult does. SEEK MEDICAL CARE IF:  You have any concerns or worries during your pregnancy. It is better to call with your questions if you feel they cannot wait, rather  than worry about them. SEEK IMMEDIATE MEDICAL CARE IF:   An unexplained oral temperature above 102 F (38.9 C) develops, or as your caregiver suggests.  You have leaking of fluid from the vagina (birth canal). If leaking membranes are suspected, take your temperature and tell your caregiver of this when you call.  There is vaginal spotting, bleeding, or passing clots. Tell your caregiver of the amount and how many pads are used. Light spotting in pregnancy is common, especially following intercourse.  You develop a bad smelling vaginal discharge with a change in the color from clear   to white.  You continue to feel sick to your stomach (nauseated) and have no relief from remedies suggested. You vomit blood or coffee ground-like materials.  You lose more than 2 pounds of weight or gain more than 2 pounds of weight over 1 week, or as suggested by your caregiver.  You notice swelling of your face, hands, feet, or legs.  You get exposed to German measles and have never had them.  You are exposed to fifth disease or chickenpox.  You develop belly (abdominal) pain. Round ligament discomfort is a common non-cancerous (benign) cause of abdominal pain in pregnancy. Your caregiver still must evaluate you.  You develop a bad headache that does not go away.  You develop fever, diarrhea, pain with urination, or shortness of breath.  You develop visual problems, blurry, or double vision.  You fall or are in a car accident or any kind of trauma.  There is mental or physical violence at home. Document Released: 02/19/2001 Document Revised: 05/20/2011 Document Reviewed: 08/24/2008 Exit[employer] Patient Information 2013 [employer].  

## 2012-07-10 NOTE — Progress Notes (Signed)
Feels well. Still has some constipation despite Metamucil and colace. Advised can use Miralax, Rx provided, declines further genetic screening.  Plan Anatomy US in 3 weeks

## 2012-07-14 ENCOUNTER — Encounter: Payer: Self-pay | Admitting: Advanced Practice Midwife

## 2012-07-16 ENCOUNTER — Ambulatory Visit (HOSPITAL_COMMUNITY)
Admission: RE | Admit: 2012-07-16 | Discharge: 2012-07-16 | Disposition: A | Discharge status: Home / Self Care | Payer: BC Managed Care – PPO | Source: Ambulatory Visit | Attending: Family | Admitting: Family

## 2012-07-16 ENCOUNTER — Telehealth: Payer: Self-pay | Admitting: *Deleted

## 2012-07-16 DIAGNOSIS — O09292 Supervision of pregnancy with other poor reproductive or obstetric history, second trimester: Secondary | ICD-10-CM

## 2012-07-16 DIAGNOSIS — Z3689 Encounter for other specified antenatal screening: Secondary | ICD-10-CM | POA: Insufficient documentation

## 2012-07-16 DIAGNOSIS — O352XX0 Maternal care for (suspected) hereditary disease in fetus, not applicable or unspecified: Secondary | ICD-10-CM | POA: Insufficient documentation

## 2012-07-16 DIAGNOSIS — O337XX0 Maternal care for disproportion due to other fetal deformities, not applicable or unspecified: Secondary | ICD-10-CM | POA: Insufficient documentation

## 2012-07-16 NOTE — Telephone Encounter (Signed)
Message copied by Granville Lewis on Thu Jul 16, 2012  8:37 AM ------      Message from: Aviva Signs      Created: Tue Jul 14, 2012 10:31 PM      Regarding: early glucola       Needs early glucola due to history of big baby ------

## 2012-07-16 NOTE — Telephone Encounter (Signed)
Spoke with pt via a phone and explained that she did have macrosomia in her last pregnancy it was recommended that she have an early 1 hr GTT at her next visit on 08/07/12.  Pt states"I am going to pass on that because I feel like my last pregnancy I didn't watch what I was eating"  I explained the complication of having a very large baby and how important it was to know if she had early gestational diabetes.  Will address again the issue at her next appointment.

## 2012-07-17 ENCOUNTER — Encounter: Payer: Self-pay | Admitting: Advanced Practice Midwife

## 2012-08-05 ENCOUNTER — Encounter: Payer: Self-pay | Admitting: Advanced Practice Midwife

## 2012-08-05 ENCOUNTER — Ambulatory Visit (INDEPENDENT_AMBULATORY_CARE_PROVIDER_SITE_OTHER): Payer: BC Managed Care – PPO | Admitting: Advanced Practice Midwife

## 2012-08-05 VITALS — BP 130/78 | Wt 186.0 lb

## 2012-08-05 DIAGNOSIS — O09299 Supervision of pregnancy with other poor reproductive or obstetric history, unspecified trimester: Secondary | ICD-10-CM

## 2012-08-05 DIAGNOSIS — O09292 Supervision of pregnancy with other poor reproductive or obstetric history, second trimester: Secondary | ICD-10-CM

## 2012-08-05 NOTE — Patient Instructions (Signed)
Glucose Tolerance Test This is a test to see how your body processes carbohydrates. This test is often done to check patients for diabetes or the possibility of developing it. PREPARATION FOR TEST You should have nothing to eat or drink 12 hours before the test. You will be given a form of sugar (glucose) and then blood samples will be drawn from your vein to determine the level of sugar in your blood. Alternatively, blood may be drawn from your finger for testing. You should not smoke or exercise during the test. NORMAL FINDINGS  Fasting: 70-115 mg/dL  30 minutes: less than 200 mg/dL  1 hour: less than 200 mg/dL  2 hours: less than 140 mg/dL  3 hours: 70-115 mg/dL  4 hours: 70-115 mg/dL Ranges for normal findings may vary among different laboratories and hospitals. You should always check with your doctor after having lab work or other tests done to discuss the meaning of your test results and whether your values are considered within normal limits. MEANING OF TEST Your caregiver will go over the test results with you and discuss the importance and meaning of your results, as well as treatment options and the need for additional tests. OBTAINING THE TEST RESULTS It is your responsibility to obtain your test results. Ask the lab or department performing the test when and how you will get your results. Document Released: 03/20/2004 Document Revised: 05/20/2011 Document Reviewed: 02/06/2008 Exit[employer] Patient Information 2014 [employer].  

## 2012-08-05 NOTE — Progress Notes (Signed)
P = 94 

## 2012-08-05 NOTE — Progress Notes (Signed)
Discussed glucola. She is concerned with ingredients in the glucola mixture. Discussed alternative of Jelly Beans. She is not thrilled but is willing to do it. They must have 50 gm of CHO, recommend normal sized Brachs #18 or them. Discussed risks of undiagnosed diabetes, including still birth. Wants to do waterbirth. Class scheduled in 3 weeks.

## 2012-09-02 ENCOUNTER — Ambulatory Visit (INDEPENDENT_AMBULATORY_CARE_PROVIDER_SITE_OTHER): Payer: BC Managed Care – PPO | Admitting: Family Medicine

## 2012-09-02 ENCOUNTER — Encounter: Payer: Self-pay | Admitting: Family Medicine

## 2012-09-02 VITALS — BP 120/70 | HR 80 | Wt 189.0 lb

## 2012-09-02 DIAGNOSIS — B029 Zoster without complications: Secondary | ICD-10-CM

## 2012-09-02 MED ORDER — ACYCLOVIR 400 MG PO TABS: ORAL_TABLET | ORAL | Status: DC

## 2012-09-02 NOTE — Progress Notes (Signed)
CC: Caitlin Jordan is a 27 y.o. female is here for Mass   Subjective: HPI:  Patient presents for same date visit complaining of rash on right buttock. This was preceded yesterday by a tingling starting in her lower right buttock radiating down the back of her leg ending at the foot. This morning she noticed a group of bumps that are tender to the touch mild to moderate severity with a subjective irritation sensation when left at rest she continues to have a tingling sensation down the back of her leg.. other than above nothing makes better or worse. Onset was abrupt seems to be progressive on a daily basis. Her symptoms were within 48 hours ago rash within 12 hours. She states she feels great other than the annoyance above. She denies fevers, chills, nausea, vomiting, decreased fetal movement, contractions, motor sensory disturbances   Review Of Systems Outlined In HPI  Past Medical History  Diagnosis Date  . PELVIC  PAIN 04/18/2006    Qualifier: Diagnosis of  By: Linford Arnold MD, Santina Evans    . MENORRHAGIA 10/26/2009    Qualifier: Diagnosis of  By: Cathren Harsh MD, Jeannett Senior    . PYELONEPHRITIS, ACUTE 12/17/2005    Qualifier: Diagnosis of  By: Jerrol Banana    . DEPRESSION 10/15/2007    Qualifier: Diagnosis of  By: Linford Arnold MD, Santina Evans    . Complication of anesthesia     epidural didn't work  . Hydronephrosis     after pregnancy no signs on xray     No family history on file.   History  Substance Use Topics  . Smoking status: Former Smoker -- 1.00 packs/day for 6 years    Types: Cigarettes    Quit date: 11/03/2011  . Smokeless tobacco: Not on file  . Alcohol Use: 1.5 - 3 oz/week    3-6 drink(s) per week     Objective: Filed Vitals:   09/02/12 1502  BP: 120/70  Pulse: 80    Vital signs reviewed. General: Alert and Oriented, No Acute Distress HEENT: Pupils equal, round, reactive to light. Conjunctivae clear.  External ears unremarkable.  Moist mucous membranes. Lungs: Clear and  comfortable work of breathing, speaking in full sentences without accessory muscle use. Cardiac: Regular rate and rhythm.  Neuro: CN II-XII grossly intact, gait normal. Extremities: No peripheral edema.  Strong peripheral pulses.  Mental Status: No depression, anxiety, nor agitation. Logical though process. Skin: Warm and dry. On the inferior right buttock there is a 4 cm x 3 cm patch of vesicles with slight erythema surrounding  Assessment & Plan: Caitlin Jordan was seen today for mass.  Diagnoses and associated orders for this visit:  Herpes zoster - acyclovir (ZOVIRAX) 400 MG tablet; Two tabs by mouth five times a day for seven days.    Discussed with patient I believe she has a shingles outbreak, she has a history of chickenpox and she was much younger, symptoms are confined to one dermatome. We discussed the safety profile of acyclovir and I like her to start it now but to contact her OB/GYN tomorrow to see if this changes management of her pregnancy as she is desiring a water birth.  Return if symptoms worsen or fail to improve.

## 2012-09-04 ENCOUNTER — Ambulatory Visit (INDEPENDENT_AMBULATORY_CARE_PROVIDER_SITE_OTHER): Payer: BC Managed Care – PPO | Admitting: Family

## 2012-09-04 VITALS — BP 108/69 | Temp 98.3°F | Wt 189.0 lb

## 2012-09-04 DIAGNOSIS — Z3482 Encounter for supervision of other normal pregnancy, second trimester: Secondary | ICD-10-CM

## 2012-09-04 DIAGNOSIS — Z3480 Encounter for supervision of other normal pregnancy, unspecified trimester: Secondary | ICD-10-CM

## 2012-09-04 NOTE — Progress Notes (Signed)
Desires infant to have tylenol prior to circumcision > will note in problem list; no additional questions or concerns; plans to bring jelly beans for glucola test in 3 weeks.  Here with husband and two brothers.

## 2012-09-25 ENCOUNTER — Ambulatory Visit (INDEPENDENT_AMBULATORY_CARE_PROVIDER_SITE_OTHER): Payer: BC Managed Care – PPO | Admitting: Family

## 2012-09-25 VITALS — BP 117/72 | Wt 194.0 lb

## 2012-09-25 DIAGNOSIS — Z3480 Encounter for supervision of other normal pregnancy, unspecified trimester: Secondary | ICD-10-CM

## 2012-09-25 DIAGNOSIS — Z23 Encounter for immunization: Secondary | ICD-10-CM

## 2012-09-25 DIAGNOSIS — Z3482 Encounter for supervision of other normal pregnancy, second trimester: Secondary | ICD-10-CM

## 2012-09-25 LAB — CBC
HCT: 34.4 % — ABNORMAL LOW (ref 36.0–46.0)
MCV: 92 fL (ref 78.0–100.0)
Platelets: 279 10*3/uL (ref 150–400)
RBC: 3.74 MIL/uL — ABNORMAL LOW (ref 3.87–5.11)
WBC: 7.7 10*3/uL (ref 4.0–10.5)

## 2012-09-25 MED ORDER — TETANUS-DIPHTH-ACELL PERTUSSIS 5-2.5-18.5 LF-MCG/0.5 IM SUSP
0.5000 mL | Freq: Once | INTRAMUSCULAR | Status: AC
  Administered 2012-09-25: 0.5 mL via INTRAMUSCULAR

## 2012-09-25 NOTE — Progress Notes (Signed)
p-93  Jelly bean challenge and Tdap today

## 2012-09-25 NOTE — Progress Notes (Signed)
Questions regarding labor; renting birth tub;  Jelly bean challenge today.

## 2012-09-28 ENCOUNTER — Telehealth: Payer: Self-pay | Admitting: *Deleted

## 2012-09-28 NOTE — Telephone Encounter (Signed)
Pt notified of normal 28 week labs 

## 2012-10-01 ENCOUNTER — Encounter: Payer: Self-pay | Admitting: Family

## 2012-10-09 ENCOUNTER — Ambulatory Visit (INDEPENDENT_AMBULATORY_CARE_PROVIDER_SITE_OTHER): Payer: BC Managed Care – PPO | Admitting: Obstetrics and Gynecology

## 2012-10-09 ENCOUNTER — Encounter: Payer: Self-pay | Admitting: Obstetrics and Gynecology

## 2012-10-09 VITALS — BP 127/69 | Wt 197.0 lb

## 2012-10-09 DIAGNOSIS — Z3483 Encounter for supervision of other normal pregnancy, third trimester: Secondary | ICD-10-CM

## 2012-10-09 DIAGNOSIS — Z3480 Encounter for supervision of other normal pregnancy, unspecified trimester: Secondary | ICD-10-CM

## 2012-10-09 NOTE — Progress Notes (Signed)
p=101 

## 2012-10-09 NOTE — Progress Notes (Signed)
Doing well except occasional dizziness. Hgb was 11.5 and takes OTC vit. Rec stay hydrated, frequent meals, measures to relieve presyncope if continues. . Glucola was 101. Has done water birth class and has tub. Pushed 3 times with first 2. Husband plans vasectomy.

## 2012-10-09 NOTE — Patient Instructions (Signed)

## 2012-10-23 ENCOUNTER — Ambulatory Visit (INDEPENDENT_AMBULATORY_CARE_PROVIDER_SITE_OTHER): Payer: BC Managed Care – PPO | Admitting: Family

## 2012-10-23 VITALS — BP 120/71 | Wt 197.0 lb

## 2012-10-23 DIAGNOSIS — Z3482 Encounter for supervision of other normal pregnancy, second trimester: Secondary | ICD-10-CM

## 2012-10-23 DIAGNOSIS — Z3480 Encounter for supervision of other normal pregnancy, unspecified trimester: Secondary | ICD-10-CM

## 2012-10-23 DIAGNOSIS — Z3493 Encounter for supervision of normal pregnancy, unspecified, third trimester: Secondary | ICD-10-CM

## 2012-10-23 NOTE — Progress Notes (Signed)
Completed waterbirth class; deciding upon renting or using FP pool; friend w/ successful waterbirth w/[place] on Sunday.  Reviewed 1 hr results.

## 2012-10-23 NOTE — Progress Notes (Signed)
P 

## 2012-11-06 ENCOUNTER — Ambulatory Visit (INDEPENDENT_AMBULATORY_CARE_PROVIDER_SITE_OTHER): Payer: BC Managed Care – PPO | Admitting: Advanced Practice Midwife

## 2012-11-06 VITALS — BP 126/76 | Wt 201.0 lb

## 2012-11-06 DIAGNOSIS — O09293 Supervision of pregnancy with other poor reproductive or obstetric history, third trimester: Secondary | ICD-10-CM

## 2012-11-06 DIAGNOSIS — O09299 Supervision of pregnancy with other poor reproductive or obstetric history, unspecified trimester: Secondary | ICD-10-CM

## 2012-11-06 NOTE — Progress Notes (Signed)
P - 96 - Pt states when she takes a step it feels like her left pubic bone is broken with pain scale 6/10

## 2012-11-06 NOTE — Progress Notes (Signed)
Doing well.  Good fetal movement, denies vaginal bleeding, LOF, regular contractions. Discussed pubic symphysis pain, recommend pregnancy support belt, warm bath, ice as needed.  Discussed waterbirth with pt. Husband coming to next appointment for waterbirth consent.

## 2012-11-17 ENCOUNTER — Encounter (HOSPITAL_COMMUNITY): Payer: Self-pay | Admitting: *Deleted

## 2012-11-17 ENCOUNTER — Inpatient Hospital Stay (HOSPITAL_COMMUNITY)
Admission: AD | Admit: 2012-11-17 | Discharge: 2012-11-18 | DRG: 379 | Disposition: A | Discharge status: Home / Self Care | Payer: BC Managed Care – PPO | Source: Ambulatory Visit | Attending: Family Medicine | Admitting: Family Medicine

## 2012-11-17 DIAGNOSIS — Z3482 Encounter for supervision of other normal pregnancy, second trimester: Secondary | ICD-10-CM

## 2012-11-17 DIAGNOSIS — O09292 Supervision of pregnancy with other poor reproductive or obstetric history, second trimester: Secondary | ICD-10-CM

## 2012-11-17 DIAGNOSIS — O4700 False labor before 37 completed weeks of gestation, unspecified trimester: Principal | ICD-10-CM | POA: Diagnosis present

## 2012-11-17 NOTE — MAU Provider Note (Signed)
Chief Complaint:  Labor Eval   First Provider Initiated Contact with Patient 11/17/12 2140     HPI: Caitlin Jordan is a 27 y.o. G3P2002 at [redacted]w[redacted]d who presents to maternity admissions reporting strong contractions every 3-5 minutes and leaking small amounts of sticky fluid x1 week. Denies fever, chills, vaginal bleeding. Good fetal movement.   Pregnancy Course: Uncomplicated.  Past Medical History: Past Medical History  Diagnosis Date  . PELVIC  PAIN 04/18/2006    Qualifier: Diagnosis of  By: Linford Arnold MD, Santina Evans    . MENORRHAGIA 10/26/2009    Qualifier: Diagnosis of  By: Cathren Harsh MD, Jeannett Senior    . PYELONEPHRITIS, ACUTE 12/17/2005    Qualifier: Diagnosis of  By: Jerrol Banana    . DEPRESSION 10/15/2007    Qualifier: Diagnosis of  By: Linford Arnold MD, Santina Evans    . Complication of anesthesia     epidural didn't work  . Hydronephrosis     after pregnancy no signs on xray    Past obstetric history: OB History  Gravida Para Term Preterm AB SAB TAB Ectopic Multiple Living  3 2 2       2     # Outcome Date GA Lbr Len/2nd Weight Sex Delivery Anes PTL Lv  3 CUR           2 TRM 03/09/11 [redacted]w[redacted]d  4.085 kg (9 lb 0.1 oz) M SVD EPI N Y  1 TRM 11/26/04 [redacted]w[redacted]d  3.997 kg (8 lb 13 oz) M SVD EPI N Y      Past Surgical History: Past Surgical History  Procedure Laterality Date  . Wisdom tooth extraction       Family History: History reviewed. No pertinent family history.  Social History: History  Substance Use Topics  . Smoking status: Former Smoker -- 1.00 packs/day for 6 years    Types: Cigarettes    Quit date: 11/03/2011  . Smokeless tobacco: Not on file  . Alcohol Use: No    Allergies: No Known Allergies  Meds:  Prescriptions prior to admission  Medication Sig Dispense Refill  . acetaminophen (TYLENOL) 325 MG tablet Take 650 mg by mouth every 6 (six) hours as needed for pain.      . flintstones complete (FLINTSTONES) 60 MG chewable tablet Chew 1 tablet by mouth daily.        ROS:  Pertinent findings in history of present illness.  Physical Exam  Height 5\' 5"  (1.651 m), weight 91.989 kg (202 lb 12.8 oz), last menstrual period 03/17/2012. GENERAL: Well-developed, well-nourished female in moderate distress.  HEENT: normocephalic HEART: normal rate RESP: normal effort ABDOMEN: Soft, non-tender, gravid appropriate for gestational age EXTREMITIES: Nontender, no edema NEURO: alert and oriented SPECULUM EXAM: NEFG, small amount of clear cervical mucus, negative pooling, no blood, cervix clean Dilation: 1.5 Effacement (%): 50;60 Cervical Position: Posterior Station: Ballotable Presentation: Vertex Exam by:: Elonda Husky RN  FHT:  Baseline 130 , moderate variability, accelerations present, no decelerations Contractions: q 3-4 mins, moderate    Labs: Results for orders placed during the hospital encounter of 11/17/12 (from the past 24 hour(s))  GROUP B STREP BY PCR     Status: None   Collection Time    11/17/12  9:55 PM      Result Value Range   Group B strep by PCR NEGATIVE  NEGATIVE  CBC     Status: Abnormal   Collection Time    11/18/12 12:10 AM      Result Value Range   WBC  13.0 (*) 4.0 - 10.5 K/uL   RBC 3.91  3.87 - 5.11 MIL/uL   Hemoglobin 11.8 (*) 12.0 - 15.0 g/dL   HCT 16.1 (*) 09.6 - 04.5 %   MCV 89.0  78.0 - 100.0 fL   MCH 30.2  26.0 - 34.0 pg   MCHC 33.9  30.0 - 36.0 g/dL   RDW 40.9  81.1 - 91.4 %   Platelets 221  150 - 400 K/uL    Imaging:  No results found.  MAU Course: Contractions intensified significantly throughout MAU visit. Now q. 2-3 minutes. Cervix 3/50/-3, soft, posterior, vertex per [place], CNM.  Patient strongly desires water birth. Informed patient that CNM strongly recommends waiting until term for water birth. Will consult with Dr. Shawnie Pons.  Assessment: Early labor  Plan: Admit to birthing suites. Routine orders.  Dorathy Kinsman, CNM 11/17/2012 10:18 PM ,

## 2012-11-17 NOTE — MAU Note (Signed)
Patient presents to MAU with c/o contractions that she states are 3-5 minutes apart. Does state leaking clear fluid for 1 week; states has not been evaluated for this, not having to wear a pad. Denies bleeding. Reports good fetal movement.

## 2012-11-18 ENCOUNTER — Encounter (HOSPITAL_COMMUNITY): Payer: Self-pay | Admitting: *Deleted

## 2012-11-18 DIAGNOSIS — O4700 False labor before 37 completed weeks of gestation, unspecified trimester: Principal | ICD-10-CM

## 2012-11-18 LAB — CBC
Hemoglobin: 11.8 g/dL — ABNORMAL LOW (ref 12.0–15.0)
MCH: 30.2 pg (ref 26.0–34.0)
MCHC: 33.9 g/dL (ref 30.0–36.0)
MCV: 89 fL (ref 78.0–100.0)
RBC: 3.91 MIL/uL (ref 3.87–5.11)

## 2012-11-18 LAB — RPR: RPR Ser Ql: NONREACTIVE

## 2012-11-18 MED ORDER — ACETAMINOPHEN 325 MG PO TABS: 650.0000 mg | ORAL_TABLET | ORAL | Status: DC | PRN

## 2012-11-18 MED ORDER — OXYCODONE-ACETAMINOPHEN 5-325 MG PO TABS: 1.0000 | ORAL_TABLET | ORAL | Status: DC | PRN

## 2012-11-18 MED ORDER — CITRIC ACID-SODIUM CITRATE 334-500 MG/5ML PO SOLN: 30.0000 mL | ORAL | Status: DC | PRN

## 2012-11-18 MED ORDER — OXYTOCIN 40 UNITS IN LACTATED RINGERS INFUSION - SIMPLE MED: 62.5000 mL/h | INTRAVENOUS | Status: DC

## 2012-11-18 MED ORDER — FLEET ENEMA 7-19 GM/118ML RE ENEM: 1.0000 | ENEMA | RECTAL | Status: DC | PRN

## 2012-11-18 MED ORDER — IBUPROFEN 600 MG PO TABS: 600.0000 mg | ORAL_TABLET | Freq: Four times a day (QID) | ORAL | Status: DC | PRN

## 2012-11-18 MED ORDER — OXYTOCIN BOLUS FROM INFUSION: 500.0000 mL | INTRAVENOUS | Status: DC

## 2012-11-18 MED ORDER — LACTATED RINGERS IV SOLN: INTRAVENOUS | Status: DC

## 2012-11-18 MED ORDER — LACTATED RINGERS IV SOLN: 500.0000 mL | INTRAVENOUS | Status: DC | PRN

## 2012-11-18 MED ORDER — ONDANSETRON HCL 4 MG/2ML IJ SOLN: 4.0000 mg | Freq: Four times a day (QID) | INTRAMUSCULAR | Status: DC | PRN

## 2012-11-18 MED ORDER — LIDOCAINE HCL (PF) 1 % IJ SOLN: 30.0000 mL | INTRAMUSCULAR | Status: DC | PRN

## 2012-11-18 NOTE — H&P (Signed)
HPI: Caitlin Jordan is a 27 y.o. year old G8P2002 female at [redacted]w[redacted]d weeks gestation by 14 week ultrasound who presents to MAU reporting labor and possible leaking of fluid. Fern, pool negative. She received prenatal care at [facility] [place]. She requests midwifery care and plans a water birth.  Maternal Medical History:  Reason for admission: Contractions.     Patient Active Problem List   Diagnosis Date Noted  . H/O macrosomia in infant in prior pregnancy, currently pregnant 07/10/2012  . Supervision of normal subsequent pregnancy 06/11/2012   Caleen Essex - Desires Waterbirth - Midwife only; Waterbirth Class completed; Obtain consent @ 36 wks  Genetic Screen First screen--not done due to being too far along. AFP declined.   Anatomic [place]  14 wk low lying; resolved at 18 wks, anatomy nml  Glucose Screen  101  GBS   Feeding Preference breast  Contraception   Circumcision  Yes, desires tylenol to be given prior to procedure.     OB History   Grav Para Term Preterm Abortions TAB SAB Ect Mult Living   3 2 2       2      Past Medical History  Diagnosis Date  . PELVIC  PAIN 04/18/2006    Qualifier: Diagnosis of  By: Linford Arnold MD, Santina Evans    . MENORRHAGIA 10/26/2009    Qualifier: Diagnosis of  By: Cathren Harsh MD, Jeannett Senior    . PYELONEPHRITIS, ACUTE 12/17/2005    Qualifier: Diagnosis of  By: Jerrol Banana    . DEPRESSION 10/15/2007    Qualifier: Diagnosis of  By: Linford Arnold MD, Santina Evans    . Complication of anesthesia     epidural didn't work  . Hydronephrosis     after pregnancy no signs on xray   Past Surgical History  Procedure Laterality Date  . Wisdom tooth extraction     Family History: family history is not on file. Social History:  reports that she quit smoking about 12 months ago. Her smoking use included Cigarettes. She has a 6 pack-year smoking history. She does not have any smokeless tobacco history on file. She reports that she does not drink alcohol or  use illicit drugs.   Prenatal Transfer Tool  Maternal Diabetes: No Genetic Screening: Declined Maternal Ultrasounds/Referrals: Normal Fetal Ultrasounds or other Referrals:  None Maternal Substance Abuse:  No Significant Maternal Medications:  None Significant Maternal Lab Results:  Lab values include: Group B Strep negative Other Comments:  None  Review of Systems  Constitutional: Negative for fever and chills.  Eyes: Negative for blurred vision and double vision.  Respiratory: Negative for shortness of breath.   Cardiovascular: Negative for chest pain.  Gastrointestinal: Negative for vomiting. Abdominal pain: contractions.  Neurological: Negative for headaches.    Dilation: 3 Effacement (%): 50 Station: -2 Exam by:: Renee Rival CNM Blood pressure 127/71, pulse 104, temperature 98.6 F (37 C), temperature source Axillary, resp. rate 18, height 5\' 5"  (1.651 m), weight 91.627 kg (202 lb), last menstrual period 03/17/2012. Maternal Exam:  Uterine Assessment: Contraction strength is moderate.  Contraction frequency is regular.   Abdomen: Patient reports no abdominal tenderness. Fundal height is Size equals dates.   Fetal presentation: vertex  Introitus: Normal vulva. Normal vagina.  Ferning test: negative.   Pelvis: adequate for delivery.   Cervix: Cervix evaluated by sterile speculum exam and digital exam.     Physical Exam  Prenatal labs: ABO, Rh: A/POS/-- (04/03 1545) Antibody: NEG (04/03 1545) Rubella: 3.22 (04/03 1545)  RPR: NON REACTIVE (09/10 0010)  HBsAg: NEGATIVE (04/03 1545)  HIV: NON REACTIVE (07/18 0917)  GBS: Negative (09/09 0000)  1 hour gtt.: 101  Assessment: 1. Labor: Early 2. Fetal Wellbeing: Category I  3. Pain Control: Comfort measures and hydrotherapy 4. GBS: Negative 5. 35.6 week IUP  Plan:  1. Admit to BS per consult with MD 2. Routine L&D orders 3. Analgesia/anesthesia PRN. 4. Informed patient and family that nothing will be done to  augment labor at this gestation, that nothing will be done to stop at either.   Patient strongly desires water birth. Informed patient that CNM strongly recommends waiting until term for water birth. Offered use of hydrotherapy and labor with delivery in bed. Patient states that she was told in the office that if she went into labor at this point that she would be permitted to proceed with a water birth. Discussed possible increased need for intervention at time birth do to late preterm gestation. Patient and FOB again strongly verbalized their desire to proceed with water birth at this time.  Per consult with Dr. Shawnie Pons, patient may proceed with planned water birth if she desires as long as she has been counseled that this goes against our recommendation and informed of the possible risks to baby-specifically possible slight delay in initiating resuscitation if necessary. Entire water birth consent read aloud to patient. Patient informed that continuous monitoring will be required. Patient and family still strongly desire attempt at water birth. Patient agrees to abandon attempt at water birth if there's any evidence of complications with the labor or birth and states she understands that the baby may need to be taken to the warmer quickly at the time of birth. She verbalizes understanding of CNM and M.D. recommendation and states that she assumes responsibility for a possible complications related to attempting a water birth with a late preterm infant.   Caitlin Jordan, Caitlin Jordan 11/18/2012, 5:45 AM

## 2012-11-18 NOTE — MAU Provider Note (Signed)
Chart reviewed and agree with management and plan.  

## 2012-11-18 NOTE — Discharge Summary (Signed)
Obstetric Discharge Summary Reason for Admission: false labor Prenatal Procedures: none Intrapartum Procedures: NA-Undelivered Complications: none  Hospital course: Dilated 1-3 cm in MAU. No further cervical change in 8 hours observation on L&D. Contractions decreased in intensity and frequency w/out intervention.   Hemoglobin  Date Value Range Status  11/18/2012 11.8* 12.0 - 15.0 g/dL Final  1/61/0960 45.4* 12.2 - 16.2 g/dL Final     HCT  Date Value Range Status  11/18/2012 34.8* 36.0 - 46.0 % Final    Physical Exam:  General: alert, cooperative and mild distress FHR category I.  Discharge Diagnoses: False labor-undelivered  Discharge Information: Date: 11/18/2012 Activity: unrestricted Diet: routine Medications: None Condition: stable Instructions: Preterm labor precautions and fetal kick counts. Discharge to: home  Follow-up Information   Follow up with [facility] [place] On 11/20/2012.   Specialty:  Obstetrics and Gynecology   Contact information:   [address], [address] [place] [address] [phone]      Follow up with [facility] [place] MATERNITY ADMISSIONS. (As needed if symptoms worsen)    Contact information:   [address] [id number] [place] [address] [phone]      Dorathy Kinsman 11/18/2012, 7:19 AM

## 2012-11-18 NOTE — Progress Notes (Signed)
Caitlin Jordan is a 27 y.o. G3P2002 at [redacted]w[redacted]d.  Subjective: No change in UC's. Coping well w/ comfort measures.   Objective: BP 127/71  Pulse 104  Temp(Src) 98.6 F (37 C) (Axillary)  Resp 18  Ht 5\' 5"  (1.651 m)  Wt 91.627 kg (202 lb)  BMI 33.61 kg/m2  LMP 03/17/2012      FHT:  FHR: 130 bpm, variability: moderate,  accelerations:  Present,  decelerations:  Absent UC:   regular, every 2-4 minutes, moderate SVE:   Dilation: 3 Effacement (%): 50 Station: -2 Exam by:: Renee Rival CNM  Labs: Lab Results  Component Value Date   WBC 13.0* 11/18/2012   HGB 11.8* 11/18/2012   HCT 34.8* 11/18/2012   MCV 89.0 11/18/2012   PLT 221 11/18/2012    Assessment / Plan: Early versus prodromal labor  Labor: Progressing normally Preeclampsia:  N/A Fetal Wellbeing:  Category I Pain Control:  Labor support without medications I/D:  n/a Anticipated MOD:  NSVD  Winda Summerall 11/18/2012, 3:54 AM

## 2012-11-20 ENCOUNTER — Ambulatory Visit (INDEPENDENT_AMBULATORY_CARE_PROVIDER_SITE_OTHER): Payer: BC Managed Care – PPO | Admitting: Family

## 2012-11-20 VITALS — BP 121/75 | Wt 203.0 lb

## 2012-11-20 DIAGNOSIS — Z3493 Encounter for supervision of normal pregnancy, unspecified, third trimester: Secondary | ICD-10-CM

## 2012-11-20 DIAGNOSIS — Z3480 Encounter for supervision of other normal pregnancy, unspecified trimester: Secondary | ICD-10-CM

## 2012-11-20 NOTE — Progress Notes (Signed)
Pt seen in hospital over weekend; wants waterbirth, was admitted for presumed labor, discharged home.  Pt adamant about having a waterbirth.  Explained practice policy of needing to be greater than 37 weeks.  Reviewed dating criteria. Pt felt she was further than EDD.  Reviewed how we date and that an ultrasound in later pregnancy is more inaccurate.  Reviewed preterm labor precautions.

## 2012-11-20 NOTE — Progress Notes (Signed)
P = 92 

## 2012-11-21 ENCOUNTER — Encounter (HOSPITAL_COMMUNITY): Payer: Self-pay | Admitting: *Deleted

## 2012-11-21 ENCOUNTER — Inpatient Hospital Stay (HOSPITAL_COMMUNITY)
Admission: AD | Admit: 2012-11-21 | Discharge: 2012-11-23 | DRG: 372 | Disposition: A | Discharge status: Home / Self Care | Payer: BC Managed Care – PPO | Source: Ambulatory Visit | Attending: Obstetrics & Gynecology | Admitting: Obstetrics & Gynecology

## 2012-11-21 ENCOUNTER — Inpatient Hospital Stay (HOSPITAL_COMMUNITY)
Admission: AD | Admit: 2012-11-21 | Discharge: 2012-11-21 | Disposition: A | Discharge status: Home / Self Care | Payer: BC Managed Care – PPO | Source: Ambulatory Visit | Attending: Obstetrics & Gynecology | Admitting: Obstetrics & Gynecology

## 2012-11-21 DIAGNOSIS — O4700 False labor before 37 completed weeks of gestation, unspecified trimester: Secondary | ICD-10-CM | POA: Insufficient documentation

## 2012-11-21 LAB — CBC
Hemoglobin: 11.1 g/dL — ABNORMAL LOW (ref 12.0–15.0)
MCH: 30 pg (ref 26.0–34.0)
MCHC: 33.9 g/dL (ref 30.0–36.0)
Platelets: 205 10*3/uL (ref 150–400)
RDW: 13.4 % (ref 11.5–15.5)

## 2012-11-21 LAB — ABO/RH: ABO/RH(D): A POS

## 2012-11-21 LAB — TYPE AND SCREEN
ABO/RH(D): A POS
Antibody Screen: NEGATIVE

## 2012-11-21 MED ORDER — SODIUM CHLORIDE 0.9 % IJ SOLN: 3.0000 mL | INTRAMUSCULAR | Status: DC | PRN

## 2012-11-21 MED ORDER — LIDOCAINE HCL (PF) 1 % IJ SOLN: 30.0000 mL | INTRAMUSCULAR | Status: DC | PRN

## 2012-11-21 MED ORDER — SIMETHICONE 80 MG PO CHEW: 80.0000 mg | CHEWABLE_TABLET | ORAL | Status: DC | PRN

## 2012-11-21 MED ORDER — DIBUCAINE 1 % RE OINT
1.0000 "application " | TOPICAL_OINTMENT | RECTAL | Status: DC | PRN
  Filled 2012-11-21: qty 28

## 2012-11-21 MED ORDER — ONDANSETRON HCL 4 MG PO TABS: 4.0000 mg | ORAL_TABLET | ORAL | Status: DC | PRN

## 2012-11-21 MED ORDER — HYDROXYZINE HCL 50 MG PO TABS: 50.0000 mg | ORAL_TABLET | Freq: Four times a day (QID) | ORAL | Status: DC | PRN

## 2012-11-21 MED ORDER — SODIUM CHLORIDE 0.9 % IV SOLN: 250.0000 mL | INTRAVENOUS | Status: DC | PRN

## 2012-11-21 MED ORDER — IBUPROFEN 600 MG PO TABS
600.0000 mg | ORAL_TABLET | Freq: Four times a day (QID) | ORAL | Status: DC | PRN
  Filled 2012-11-21: qty 1

## 2012-11-21 MED ORDER — OXYTOCIN 10 UNIT/ML IJ SOLN
INTRAMUSCULAR | Status: AC
  Administered 2012-11-21: 20 [IU]
  Filled 2012-11-21: qty 2

## 2012-11-21 MED ORDER — ONDANSETRON HCL 4 MG/2ML IJ SOLN: 4.0000 mg | INTRAMUSCULAR | Status: DC | PRN

## 2012-11-21 MED ORDER — OXYCODONE-ACETAMINOPHEN 5-325 MG PO TABS
1.0000 | ORAL_TABLET | ORAL | Status: DC | PRN
  Administered 2012-11-21: 1 via ORAL
  Filled 2012-11-21: qty 1

## 2012-11-21 MED ORDER — BISACODYL 10 MG RE SUPP
10.0000 mg | Freq: Every day | RECTAL | Status: DC | PRN
  Filled 2012-11-21: qty 1

## 2012-11-21 MED ORDER — ONDANSETRON HCL 4 MG/2ML IJ SOLN: 4.0000 mg | Freq: Four times a day (QID) | INTRAMUSCULAR | Status: DC | PRN

## 2012-11-21 MED ORDER — ZOLPIDEM TARTRATE 5 MG PO TABS: 5.0000 mg | ORAL_TABLET | Freq: Every evening | ORAL | Status: DC | PRN

## 2012-11-21 MED ORDER — TETANUS-DIPHTH-ACELL PERTUSSIS 5-2.5-18.5 LF-MCG/0.5 IM SUSP
0.5000 mL | Freq: Once | INTRAMUSCULAR | Status: DC
  Filled 2012-11-21: qty 0.5

## 2012-11-21 MED ORDER — FLEET ENEMA 7-19 GM/118ML RE ENEM: 1.0000 | ENEMA | RECTAL | Status: DC | PRN

## 2012-11-21 MED ORDER — BENZOCAINE-MENTHOL 20-0.5 % EX AERO
1.0000 "application " | INHALATION_SPRAY | CUTANEOUS | Status: DC | PRN
  Filled 2012-11-21: qty 56

## 2012-11-21 MED ORDER — WITCH HAZEL-GLYCERIN EX PADS: 1.0000 "application " | MEDICATED_PAD | CUTANEOUS | Status: DC | PRN

## 2012-11-21 MED ORDER — ACETAMINOPHEN 325 MG PO TABS: 650.0000 mg | ORAL_TABLET | ORAL | Status: DC | PRN

## 2012-11-21 MED ORDER — MENTHOL 3 MG MT LOZG
1.0000 | LOZENGE | OROMUCOSAL | Status: DC | PRN
  Administered 2012-11-21: 3 mg via ORAL
  Filled 2012-11-21: qty 9

## 2012-11-21 MED ORDER — LANOLIN HYDROUS EX OINT: TOPICAL_OINTMENT | CUTANEOUS | Status: DC | PRN

## 2012-11-21 MED ORDER — LIDOCAINE HCL (PF) 1 % IJ SOLN
INTRAMUSCULAR | Status: AC
  Filled 2012-11-21: qty 30

## 2012-11-21 MED ORDER — CITRIC ACID-SODIUM CITRATE 334-500 MG/5ML PO SOLN: 30.0000 mL | ORAL | Status: DC | PRN

## 2012-11-21 MED ORDER — MEASLES, MUMPS & RUBELLA VAC ~~LOC~~ INJ
0.5000 mL | INJECTION | Freq: Once | SUBCUTANEOUS | Status: DC
  Filled 2012-11-21: qty 0.5

## 2012-11-21 MED ORDER — OXYTOCIN 40 UNITS IN LACTATED RINGERS INFUSION - SIMPLE MED: 62.5000 mL/h | INTRAVENOUS | Status: DC | PRN

## 2012-11-21 MED ORDER — FLEET ENEMA 7-19 GM/118ML RE ENEM: 1.0000 | ENEMA | Freq: Every day | RECTAL | Status: DC | PRN

## 2012-11-21 MED ORDER — SENNOSIDES-DOCUSATE SODIUM 8.6-50 MG PO TABS
2.0000 | ORAL_TABLET | Freq: Every day | ORAL | Status: DC
  Administered 2012-11-22: 2 via ORAL

## 2012-11-21 MED ORDER — DIPHENHYDRAMINE HCL 25 MG PO CAPS: 25.0000 mg | ORAL_CAPSULE | Freq: Four times a day (QID) | ORAL | Status: DC | PRN

## 2012-11-21 MED ORDER — OXYCODONE-ACETAMINOPHEN 5-325 MG PO TABS
1.0000 | ORAL_TABLET | ORAL | Status: DC | PRN
  Administered 2012-11-21 – 2012-11-22 (×4): 1 via ORAL
  Filled 2012-11-21 (×3): qty 1

## 2012-11-21 MED ORDER — IBUPROFEN 600 MG PO TABS
600.0000 mg | ORAL_TABLET | Freq: Four times a day (QID) | ORAL | Status: DC
  Administered 2012-11-21 – 2012-11-23 (×7): 600 mg via ORAL
  Filled 2012-11-21 (×8): qty 1

## 2012-11-21 MED ORDER — PRENATAL MULTIVITAMIN CH
1.0000 | ORAL_TABLET | Freq: Every day | ORAL | Status: DC
  Administered 2012-11-22 – 2012-11-23 (×2): 1 via ORAL
  Filled 2012-11-21 (×2): qty 1

## 2012-11-21 MED ORDER — SODIUM CHLORIDE 0.9 % IJ SOLN: 3.0000 mL | Freq: Two times a day (BID) | INTRAMUSCULAR | Status: DC

## 2012-11-21 NOTE — Progress Notes (Signed)
Dr Caleb Popp notified of pt's admission and assessment

## 2012-11-21 NOTE — MAU Note (Signed)
Regular contractions since 0130. No bleeding or leaking

## 2012-11-21 NOTE — MAU Note (Signed)
Pt up to BR and then standing at bedside when return to room. EFM in place

## 2012-11-21 NOTE — Progress Notes (Signed)
FHR irreg with no pattern noted to irregularity

## 2012-11-21 NOTE — Progress Notes (Signed)
Pt into tub.  Temp 99.

## 2012-11-21 NOTE — Progress Notes (Signed)
Notified pt is 6-7, 0/+1 station, going to room 161. Desires water birth.

## 2012-11-21 NOTE — MAU Note (Signed)
Pt presents with complaints of contractions throughout the night. Husband states that pt was evaluated in MAU last night and sent home and was 4.5 cms dilated.

## 2012-11-21 NOTE — Progress Notes (Signed)
States ctxs are less intense than when at home earlier [employer]

## 2012-11-21 NOTE — H&P (Signed)

## 2012-11-21 NOTE — Progress Notes (Signed)
Philipp Deputy CNM notified of pt's cervical reck. Pt stable for d/c home.

## 2012-11-21 NOTE — Progress Notes (Signed)
Written and verbal d/c instructions given and understanding voiced. 

## 2012-11-21 NOTE — H&P (Signed)
Caitlin Jordan is a 27 y.o. G68P2002 female at [redacted]w[redacted]d by 14.0wk u/s, presenting in active labor @ 6cm, uc's began around 0100, denies srom. Reports good fm, denies vb.  She was admitted previously on 9/9 pm in early labor at 3cm and labored in tub for a long period of time before being d/c'd home in false ptl.  She initiated pnc @ 12.2wks at [facility].  Declined genetic screening, anatomy u/s normal female, low-lying placenta @ 14wks- resolved @ 18wks, 1hr glucola 101, gbs neg by pcr on 9/9. Informed upon admission that pt is requesting waterbirth, pt states she has been told in office that she would be able to have waterbirth even if preterm. Prenatal record notes indicate the pt was notified yesterday that waterbirth is not recommended if <37wks. D/t prematurity at 36.2wks, I discussed this with Sid Falcon, FP CNM director who states it is ok for pt to labor in tub, but b/c she is <37wks it is recommended to get out for birth, but that we could not physically remove her from tub if she declined.  Also discussed w/ Dr. Macon Large- who states that as long as pt understands that she is going against our recommendations of birthing outside of the tub d/t prematurity we can not physically stop her from birthing in the tub.  Pt and fob have been informed multiple times previously, and again today by myself, that waterbirth is not recommended <37wks d/t potential risks r/t prematurity and that we strongly recommend against waterbirth at her gestational age.  Option given to labor in water and then get out for birth, however pt and fob remain adamant that she wants to proceed w/ a waterbirth despite our recommendations, and understand the potential risks of doing so.  Pt also declines continuous fhr monitoring, only wants intermittent.  Pt had waterbirth class, informed consent was obtained on 9/9, and again today.   Maternal Medical History:  Reason for admission: Contractions.   Contractions: Onset was 6-12 hours ago.    Frequency: regular.   Perceived severity is strong.    Fetal activity: Perceived fetal activity is normal.   Last perceived fetal movement was within the past hour.    Prenatal complications: no prenatal complications Prenatal Complications - Diabetes: none.    OB History   Grav Para Term Preterm Abortions TAB SAB Ect Mult Living   3 2 2       2      Past Medical History  Diagnosis Date  . PELVIC  PAIN 04/18/2006    Qualifier: Diagnosis of  By: Linford Arnold MD, Santina Evans    . MENORRHAGIA 10/26/2009    Qualifier: Diagnosis of  By: Cathren Harsh MD, Jeannett Senior    . PYELONEPHRITIS, ACUTE 12/17/2005    Qualifier: Diagnosis of  By: Jerrol Banana    . DEPRESSION 10/15/2007    Qualifier: Diagnosis of  By: Linford Arnold MD, Santina Evans    . Hydronephrosis     after pregnancy no signs on xray   Past Surgical History  Procedure Laterality Date  . Wisdom tooth extraction     Family History: family history is not on file. Social History:  reports that she quit smoking about 12 months ago. Her smoking use included Cigarettes. She has a 6 pack-year smoking history. She does not have any smokeless tobacco history on file. She reports that she does not drink alcohol or use illicit drugs.  Review of Systems  Constitutional: Negative.   HENT: Negative.   Eyes: Negative.   Respiratory:  Negative.   Cardiovascular: Negative.   Gastrointestinal: Positive for abdominal pain (uc's).  Genitourinary: Negative.   Musculoskeletal: Negative.   Skin: Negative.   Neurological: Negative.   Endo/Heme/Allergies: Negative.   Psychiatric/Behavioral: Negative.     Dilation: 7 Effacement (%): 90 Station: +1 Exam by:: K Booker CNM Blood pressure 140/86, pulse 90, temperature 98.7 F (37.1 C), temperature source Oral, resp. rate 20, last menstrual period 03/17/2012. Maternal Exam:  Uterine Assessment: Contraction strength is moderate.  Contraction frequency is regular.   Abdomen: Patient reports no abdominal tenderness.  Fetal presentation: vertex  Introitus: Normal vulva. Normal vagina.  Pelvis: adequate for delivery.   Cervix: Cervix evaluated by digital exam.     Fetal Exam Fetal Monitor Review: Mode: ultrasound.   Baseline rate: 145.  Variability: moderate (6-25 bpm).   Pattern: accelerations present and no decelerations.    Fetal State Assessment: Category I - tracings are normal.     Physical Exam  Constitutional: She is oriented to person, place, and time. She appears well-developed and well-nourished.  HENT:  Head: Normocephalic.  Neck: Normal range of motion.  Cardiovascular: Normal rate and regular rhythm.   Respiratory: Effort normal and breath sounds normal.  GI: Soft.  Gravid, recent FH of 38cm  Genitourinary:  SVE upon arrival to MAU by RN: 6/100/0to+1  Musculoskeletal: Normal range of motion.  Neurological: She is alert and oriented to person, place, and time. She has normal reflexes.  Skin: Skin is warm and dry.  Psychiatric: She has a normal mood and affect. Her behavior is normal. Judgment and thought content normal.    Prenatal labs: ABO, Rh: A/POS/-- (04/03 1545) Antibody: NEG (04/03 1545) Rubella: 3.22 (04/03 1545) RPR: NON REACTIVE (09/10 0010)  HBsAg: NEGATIVE (04/03 1545)  HIV: NON REACTIVE (07/18 0917)  GBS: Negative (09/09 0000)   Assessment/Plan: A:    [redacted]w[redacted]d SIUP  G3P2002   Active PTL  Cat I FHR  GBS neg  Requesting waterbirth  P:  Admit to BS  Expectant management  Anticipate NSVD  Marge Duncans 11/21/2012, 10:57 AM

## 2012-11-21 NOTE — Progress Notes (Signed)
Philipp Deputy CNM notified of pt's recheck of cervix and that ctxs feel less intense to pt. Will reck in another hour. Pt aware and agrees

## 2012-11-21 NOTE — Progress Notes (Addendum)
FHT's for less than a minute.  Pt would not let this RN monitor FHT's for any longer.  Instructed pt and family that will check FHT's every 30 minutes per policy and MD orders.

## 2012-11-22 LAB — CBC
HCT: 31.1 % — ABNORMAL LOW (ref 36.0–46.0)
MCV: 89.1 fL (ref 78.0–100.0)
Platelets: 205 10*3/uL (ref 150–400)
RBC: 3.49 MIL/uL — ABNORMAL LOW (ref 3.87–5.11)
RDW: 13.4 % (ref 11.5–15.5)
WBC: 13.5 10*3/uL — ABNORMAL HIGH (ref 4.0–10.5)

## 2012-11-22 NOTE — Progress Notes (Addendum)
Post Partum Day 1 Subjective: Eating, drinking, voiding, ambulating well.  +BM.  Lochia and pain wnl.  Denies dizziness, lightheadedness, or sob. No complaints. Baby in NICU d/t resp distress, doing better, may get to come out later on today  Objective: Blood pressure 125/76, pulse 70, temperature 97.3 F (36.3 C), temperature source Axillary, resp. rate 18, last menstrual period 03/17/2012, SpO2 98.00%, unknown if currently breastfeeding.  Physical Exam:  General: alert, cooperative and no distress Lochia: appropriate Uterine Fundus: firm Incision: n/a DVT Evaluation: No evidence of DVT seen on physical exam. Negative Homan's sign. No cords or calf tenderness. No significant calf/ankle edema.   Recent Labs  11/21/12 1130 11/22/12 0605  HGB 11.1* 10.5*  HCT 32.7* 31.1*    Assessment/Plan: Plan for discharge tomorrow Breastfeeding/pumping while infant in nicu, declines contraception, desires IP circumcision if possible Declines early d/c today d/t infant in nicu- plan d/c tomorrow   LOS: 1 day   Marge Duncans 11/22/2012, 7:05 AM

## 2012-11-23 ENCOUNTER — Ambulatory Visit: Payer: Self-pay

## 2012-11-23 ENCOUNTER — Encounter (HOSPITAL_COMMUNITY)
Admission: RE | Admit: 2012-11-23 | Discharge: 2012-11-23 | Disposition: A | Discharge status: Home / Self Care | Payer: BC Managed Care – PPO | Source: Ambulatory Visit | Attending: Obstetrics & Gynecology | Admitting: Obstetrics & Gynecology

## 2012-11-23 DIAGNOSIS — O923 Agalactia: Secondary | ICD-10-CM | POA: Insufficient documentation

## 2012-11-23 MED ORDER — IBUPROFEN 600 MG PO TABS: 600.0000 mg | ORAL_TABLET | Freq: Four times a day (QID) | ORAL | Status: AC

## 2012-11-23 NOTE — Lactation Note (Signed)
This note was copied from the chart of Caitlin Jordan. Lactation Consultation Note   Follow up consult with this mom of a nICU baby, now 48 hours post partum, and 36 4/[redacted] weeks gestation. The baby is vented, with a chest tube, probable pneumonia. Mom is being discharged to home today. She is an experienced breast feeder, and is doing well with pumping . She rented a DEP, and was instructed in it's use. I reviewed hand expression with mom, and she has easily expressed transitional milk. I will follow this family in the NICU. Mom knows to call for questions/concenrs.  Patient Name: Caitlin Keonna Raether [id number] Date: 11/23/2012 Reason for consult: Follow-up assessment;NICU baby   Maternal Data    Feeding    LATCH Score/Interventions                      Lactation Tools Discussed/Used Pump Review: Setup, frequency, and cleaning;Milk Storage;Other (comment) (hand expression, discharge pumping teaching)   Consult Status Consult Status: PRN Follow-up type:  (in NICU)    Alfred Levins 11/23/2012, 2:29 PM

## 2012-11-23 NOTE — Discharge Summary (Signed)
Obstetric Discharge Summary Reason for Admission: onset of labor Prenatal Procedures: ultrasound Intrapartum Procedures: spontaneous vaginal delivery Postpartum Procedures: none Complications-Operative and Postpartum: Right labial Hemoglobin  Date Value Range Status  11/22/2012 10.5* 12.0 - 15.0 g/dL Final  1/61/0960 45.4* 12.2 - 16.2 g/dL Final     HCT  Date Value Range Status  11/22/2012 31.1* 36.0 - 46.0 % Final    Physical Exam:  Filed Vitals:   11/23/12 0550  BP: 98/61  Pulse: 72  Temp: 98 F (36.7 C)  Resp: 18   General: alert, cooperative and appears stated age 89: appropriate Uterine Fundus: firm Perineum: small laceration seen on right labia, well approximated; area avulsion seen above the laceration (unable to repair) Incision: n/a DVT Evaluation: No evidence of DVT seen on physical exam. Negative Homan's sign.   Discharge Diagnoses: Preterm Delivery  Discharge Information: Date: 11/23/2012 Activity: pelvic rest Diet: routine Medications: Ibuprofen Condition: stable Instructions: refer to practice specific booklet Discharge to: home Follow-up Information   Follow up with [facility] [place] In 6 weeks.   Specialty:  Obstetrics and Gynecology   Contact information:   [address] [place] [address] [phone]      Newborn Data: Live born female  Birth Weight: 8 lb 11.2 oz (3945 g) APGAR: 7, 7  Home with mother.  [facility] 11/23/2012, 9:01 AM

## 2012-11-23 NOTE — Discharge Summary (Signed)
Attestation of Attending Supervision of Advanced Practitioner (CNM/NP): Evaluation and management procedures were performed by the Advanced Practitioner under my supervision and collaboration.  I have reviewed the Advanced Practitioner's note and chart, and I agree with the management and plan.  Nike Southwell 11/23/2012 9:31 AM

## 2012-11-25 ENCOUNTER — Ambulatory Visit: Payer: Self-pay

## 2012-11-25 NOTE — Lactation Note (Signed)
This note was copied from the chart of Caitlin Nyazia Canevari. Lactation Consultation Note     Brief follow up consult with this mom of a NICU baby, term, on ventilator with chest tube. Mom reports that she has "LOTS of milk" and pumping is going well. She denies andy breast discomfort. Mom is eager to hold her baby, but very willing to wait until he is stable enough. i will follow this family in the NICU. Mom knows to call for questions/concerns.  Patient Name: Caitlin Jordan [id number] Date: 11/25/2012 Reason for consult: NICU baby   Maternal Data    Feeding Feeding Type: Breast Milk Length of feed: 30 min  LATCH Score/Interventions                      Lactation Tools Discussed/Used     Consult Status Consult Status: PRN Follow-up type: In-patient (in NIICU)    Caitlin Jordan 11/25/2012, 3:53 PM

## 2012-11-27 ENCOUNTER — Encounter: Payer: BC Managed Care – PPO | Admitting: Family

## 2012-12-08 ENCOUNTER — Ambulatory Visit: Payer: Self-pay

## 2012-12-08 NOTE — Lactation Note (Signed)
This note was copied from the chart of Caitlin Dierdre Mccalip. Lactation Consultation Note    Follow up consult with this mom of a NICU baby, now 11 weeks old, and 38 5/7 weeks corrected gestation. The baby was very sick after birth, on vent, chest tubes. He is now on room air, and is being fed 23/ hour continuous TP. He was spitting when getting fed ng. Mom came in today to breast feed. He had been NPO for 8 hours, receiving clear fluid vis IV. He breast fed willingly from mom, for 1 hour on and off. He transferred 26 mls in that time, verified by a pre and post weight. Mom is going to breast feed when she visit, but since he reufuses a bottle, he is still being fed continuous transpyloric.I will follow this family in the NICU  Patient Name: Caitlin Jordan [id number] Date: 12/08/2012 Reason for consult: Follow-up assessment;NICU baby   Maternal Data    Feeding Feeding Type: Breast Milk Length of feed: 60 min (on and off sucking)  LATCH Score/Interventions Latch: Grasps breast easily, tongue down, lips flanged, rhythmical sucking.  Audible Swallowing: A few with stimulation  Type of Nipple: Everted at rest and after stimulation  Comfort (Breast/Nipple): Soft / non-tender     Hold (Positioning): Assistance needed to correctly position infant at breast and maintain latch. Intervention(s): Breastfeeding basics reviewed;Support Pillows;Position options;Skin to skin  LATCH Score: 8  Lactation Tools Discussed/Used     Consult Status Consult Status: Follow-up Follow-up type:  (prn in NICU)    Alfred Levins 12/08/2012, 3:31 PM

## 2012-12-31 ENCOUNTER — Ambulatory Visit (INDEPENDENT_AMBULATORY_CARE_PROVIDER_SITE_OTHER): Payer: BC Managed Care – PPO | Admitting: Obstetrics & Gynecology

## 2012-12-31 ENCOUNTER — Encounter: Payer: Self-pay | Admitting: Obstetrics & Gynecology

## 2012-12-31 NOTE — Progress Notes (Signed)
  Subjective:    Patient ID: Caitlin Jordan, female    DOB: 1986-02-06, 27 y.o.   MRN: [id number]  HPI  27 yo MW P3 (all boys) here for her 6 week pp exam after a NSVD water birth at 33 1/[redacted] weeks EGA. She denies any problems except long standing mild constipation. She reports normal bladder function. She has not had sex yet. Her husband is scheduled for a vasectomy. They will use condoms until he is cleared. She is breastfeeding without difficulty and the baby is growing well. She denies depression and scored 0 on the test.  Review of Systems She reports always normal pap smears    Objective:   Physical Exam  Normal vulva and vagina Normally involuted NSSA, mobile uterus and normal adnexal exam      Assessment & Plan:  Post partum- doing well RTC 1 year for annual/prn sooner

## 2014-01-10 ENCOUNTER — Encounter: Payer: Self-pay | Admitting: Obstetrics & Gynecology

## 2014-06-10 IMAGING — US US OB FOLLOW-UP
1 series · 12 of 28 positions shown · non-contrast
Comparison: none

[Series 1: us ob follow-up · 0.19mm/px · 12 of 87 slices shown]
[im 4/87]
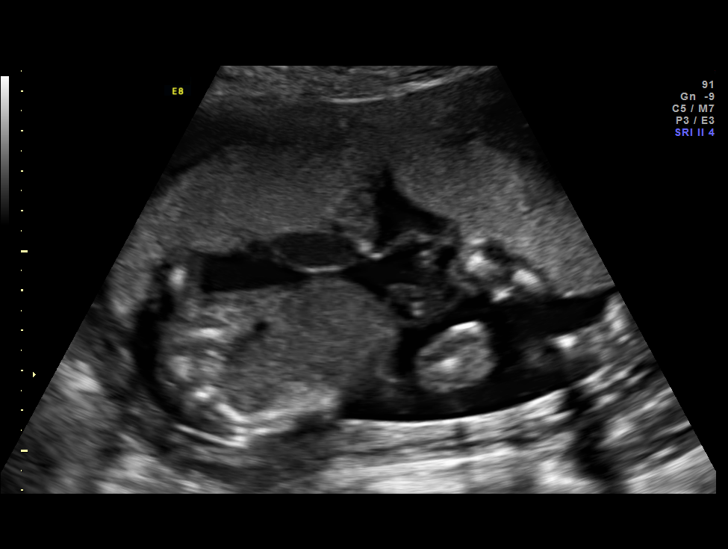
[im 10/87]
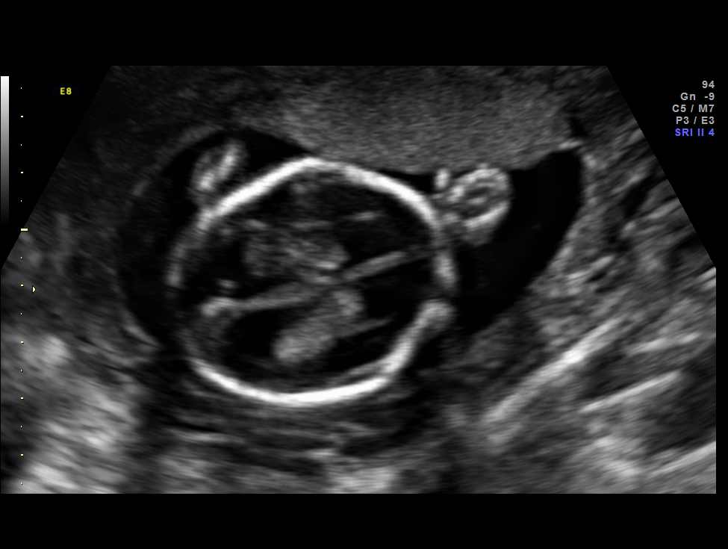
[im 16/87]
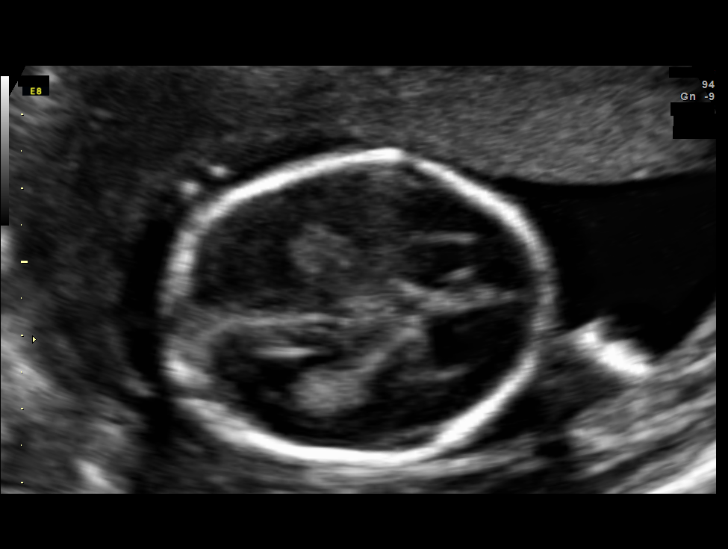
[im 26/87]
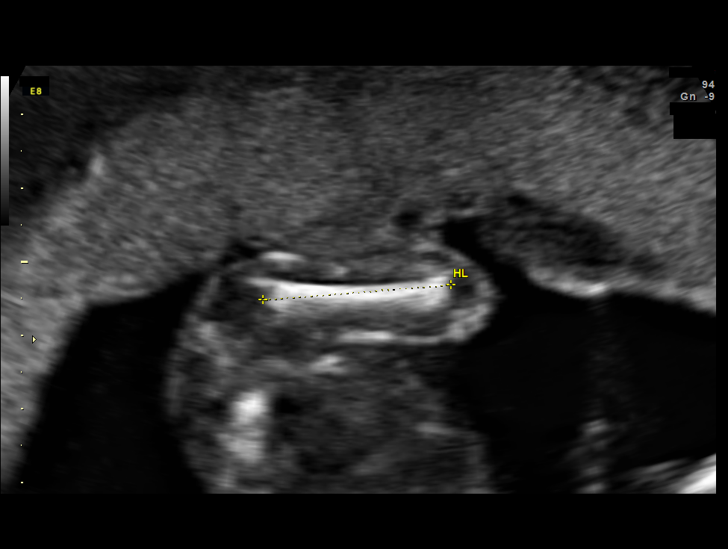
[im 32/87]
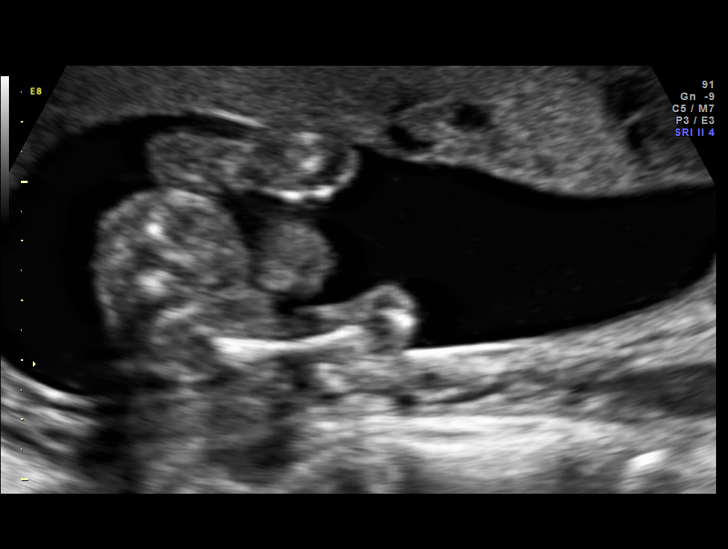
[im 39/87]
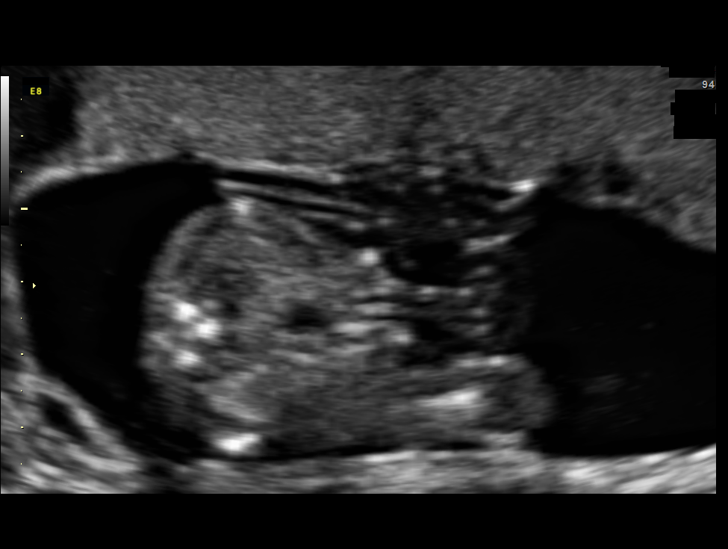
[im 48/87]
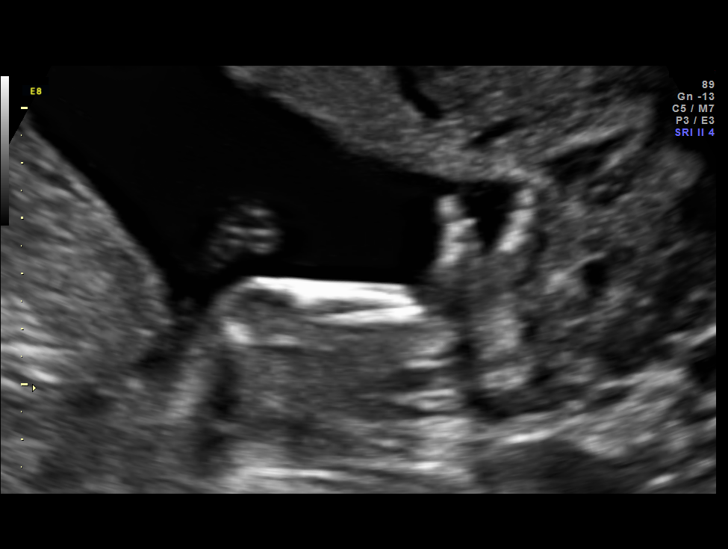
[im 55/87]
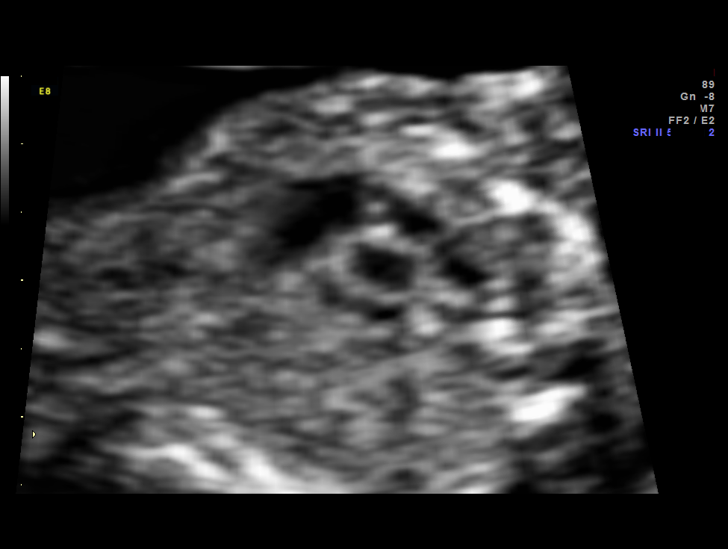
[im 61/87]
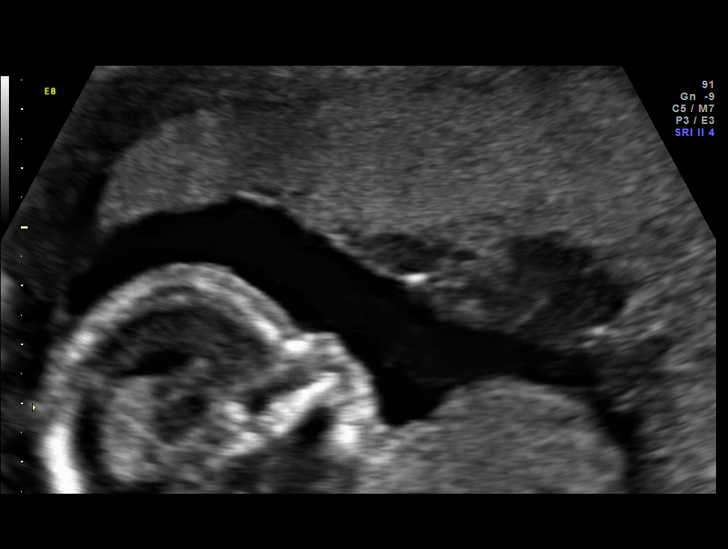
[im 71/87]
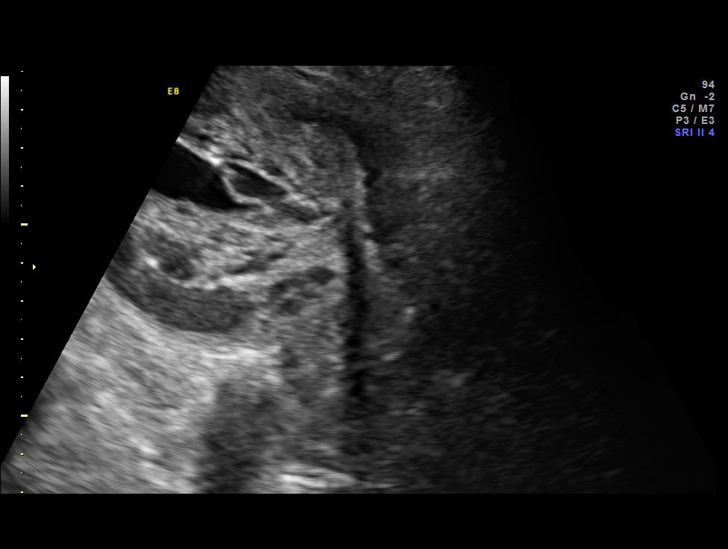
[im 77/87]
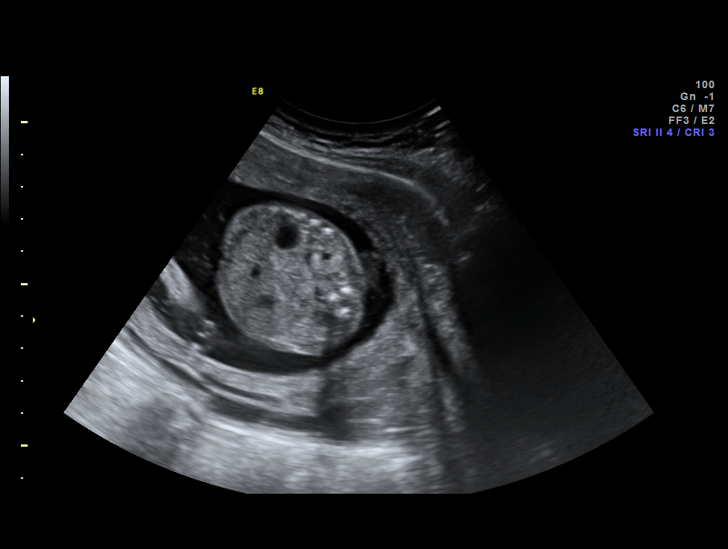
[im 83/87]
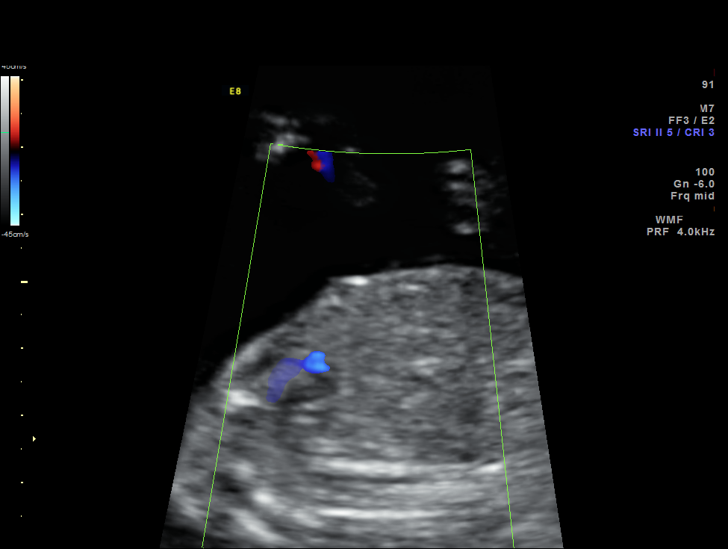

[12 of 28 positions shown; findings below may reference images not displayed]

OBSTETRICS REPORT
                      (Signed Final 07/16/2012 [DATE])

Service(s) Provided

 US OB FOLLOW UP                                       76816.1
Indications

 Basic anatomic survey
 Poor obstetric history: Previous macrosomia
Fetal Evaluation

 Num Of Fetuses:    1
 Fetal Heart Rate:  178                          bpm
 Cardiac Activity:  Observed
 Presentation:      Variable
 Placenta:          Anterior, above cervical os
 P. Cord            Visualized
 Insertion:

 Amniotic Fluid
 AFI FV:      Subjectively within normal limits
                                             Larg Pckt:     4.1  cm
Biometry

 BPD:     41.5  mm     G. Age:  18w 4d                CI:         77.0   70 - 86
 OFD:     53.9  mm                                    FL/HC:      16.8   15.8 -
                                                                         18
 HC:     152.1  mm     G. Age:  18w 1d       54  %    HC/AC:      1.09   1.07 -

 AC:     139.1  mm     G. Age:  19w 2d       85  %    FL/BPD:
 FL:      25.6  mm     G. Age:  17w 5d       36  %    FL/AC:      18.4   20 - 24
 HUM:     25.8  mm     G. Age:  18w 0d       59  %
 CER:       20  mm     G. Age:  19w 0d       80  %
 NFT:      3.8  mm

 Est. FW:     245  gm      0 lb 9 oz     58  %
Gestational Age

 LMP:           17w 2d        Date:  03/17/12                 EDD:   12/22/12
 U/S Today:     18w 3d                                        EDD:   12/14/12
 Best:          18w 0d     Det. By:  U/S C R L (06/18/12)     EDD:   12/17/12
Anatomy
 Cranium:          Appears normal         Aortic Arch:      Appears normal
 Fetal Cavum:      Appears normal         Ductal Arch:      Appears normal
 Ventricles:       Appears normal         Diaphragm:        Appears normal
 Choroid Plexus:   Appears normal         Stomach:          Appears normal, left
                                                            sided
 Cerebellum:       Appears normal         Abdomen:          Appears normal
 Posterior Fossa:  Appears normal         Abdominal Wall:   Appears nml (cord
                                                            insert, abd wall)
 Nuchal Fold:      Appears normal         Cord Vessels:     Appears normal (3
                                                            vessel cord)
 Face:             Appears normal         Kidneys:          Appear normal
                   (orbits and profile)
 Lips:             Appears normal         Bladder:          Appears normal
 Heart:            Appears normal         Spine:            Appears normal
                   (4CH, axis, and
                   situs)
 RVOT:             Appears normal         Lower             Appears normal
                                          Extremities:
 LVOT:             Appears normal         Upper             Appears normal
                                          Extremities:

 Other:  Fetus appears to be a male. Heels and 5th digit appear normal.
Targeted Anatomy

 Fetal Central Nervous System
 Cisterna Magna:
Cervix Uterus Adnexa

 Cervix:       Normal appearance by transabdominal scan. Appears
               closed, without funnelling.
 Left Ovary:    Within normal limits.
 Right Ovary:   Not visualized. No adnexal mass visualized.
Impression

 IUP at 18+0 weeks
 Normal detailed fetal anatomy
 Markers of aneuploidy: none
 Normal amniotic fluid volume
 Measurements consistent with LMP and prior US
Recommendations

 Follow-up as clinically indicated

## 2018-09-15 ENCOUNTER — Other Ambulatory Visit: Payer: Self-pay

## 2018-09-15 ENCOUNTER — Encounter: Payer: Self-pay | Admitting: Certified Nurse Midwife

## 2018-09-15 ENCOUNTER — Ambulatory Visit (INDEPENDENT_AMBULATORY_CARE_PROVIDER_SITE_OTHER): Payer: Self-pay | Admitting: Certified Nurse Midwife

## 2018-09-15 VITALS — BP 115/68 | HR 64 | Resp 16 | Ht 66.0 in | Wt 173.0 lb

## 2018-09-15 DIAGNOSIS — R102 Pelvic and perineal pain unspecified side: Secondary | ICD-10-CM

## 2018-09-15 DIAGNOSIS — Z124 Encounter for screening for malignant neoplasm of cervix: Secondary | ICD-10-CM

## 2018-09-15 DIAGNOSIS — Z113 Encounter for screening for infections with a predominantly sexual mode of transmission: Secondary | ICD-10-CM

## 2018-09-15 DIAGNOSIS — Z1151 Encounter for screening for human papillomavirus (HPV): Secondary | ICD-10-CM

## 2018-09-15 DIAGNOSIS — Z01419 Encounter for gynecological examination (general) (routine) without abnormal findings: Secondary | ICD-10-CM

## 2018-09-15 NOTE — Progress Notes (Signed)
GYNECOLOGY ANNUAL PREVENTATIVE CARE ENCOUNTER NOTE  History:     Caitlin Jordan is a 33 y.o. [phone] female here for a new patient annual gynecologic exam.  Current complaints: pelvic pain and painful periods. She reports in addition to complaints today also reports pain during intercourse. Denies abnormal vaginal bleeding, discharge or other gynecologic concerns.    Gynecologic History Patient's last menstrual period was 08/30/2018. Contraception: none Last Pap: 2014. Results were: normal with negative HPV per patient  Obstetric History OB History  Gravida Para Term Preterm AB Living  3 3 2 1   3   SAB TAB Ectopic Multiple Live Births          3    # Outcome Date GA Lbr Len/2nd Weight Sex Delivery Anes PTL Lv  3 Preterm 11/21/12 1631w2d 09:39 / 00:04 8 lb 11.2 oz (3.945 kg) M Vag-Spont None  LIV  2 Term 03/09/11 7467w0d  9 lb 0.1 oz (4.085 kg) M Vag-Spont EPI N LIV  1 Term 11/26/04 7171w0d  8 lb 13 oz (3.997 kg) M Vag-Spont EPI N LIV    Past Medical History:  Diagnosis Date  . DEPRESSION 10/15/2007   Qualifier: Diagnosis of  By: Linford ArnoldMetheney MD, Santina Evansatherine    . Hydronephrosis    after pregnancy no signs on xray  . MENORRHAGIA 10/26/2009   Qualifier: Diagnosis of  By: Cathren HarshBeese MD, Jeannett SeniorStephen    . PELVIC  PAIN 04/18/2006   Qualifier: Diagnosis of  By: Linford ArnoldMetheney MD, Santina Evansatherine    . PYELONEPHRITIS, ACUTE 12/17/2005   Qualifier: Diagnosis of  By: Jerrol BananaJennings, Debby      Past Surgical History:  Procedure Laterality Date  . APPENDECTOMY    . WISDOM TOOTH EXTRACTION      Current Outpatient Medications on File Prior to Visit  Medication Sig Dispense Refill  . cholecalciferol (VITAMIN D3) 25 MCG (1000 UT) tablet Take 1,000 Units by mouth daily.    Marland Kitchen. acetaminophen (TYLENOL) 325 MG tablet Take 650 mg by mouth every 6 (six) hours as needed for pain.    Marland Kitchen. ibuprofen (ADVIL,MOTRIN) 600 MG tablet Take 1 tablet (600 mg total) by mouth every 6 (six) hours. (Patient not taking: Reported on 09/15/2018) 30 tablet  0   No current facility-administered medications on file prior to visit.     No Known Allergies  Social History:  reports that she quit smoking about 6 years ago. Her smoking use included cigarettes. She has a 6.00 pack-year smoking history. She has never used smokeless tobacco. She reports current alcohol use. She reports that she does not use drugs.  Family History  Problem Relation Age of Onset  . Cancer Paternal Great-grandmother     The following portions of the patient's history were reviewed and updated as appropriate: allergies, current medications, past family history, past medical history, past social history, past surgical history and problem list.  Review of Systems Pertinent items noted in HPI and remainder of comprehensive ROS otherwise negative.  Physical Exam:  BP 115/68   Pulse 64   Resp 16   Ht 5\' 6"  (1.676 m)   Wt 173 lb (78.5 kg)   LMP 08/30/2018   Breastfeeding No   BMI 27.92 kg/m  CONSTITUTIONAL: Well-developed, well-nourished female in no acute distress.  HENT:  Normocephalic, atraumatic, External right and left ear normal. Oropharynx is clear and moist EYES: Conjunctivae and EOM are normal. Pupils are equal, round, and reactive to light. NECK: Normal range of motion, supple, no masses.  Normal  thyroid.  SKIN: Skin is warm and dry. No rash noted. Not diaphoretic. No erythema. No pallor. MUSCULOSKELETAL: Normal range of motion. No tenderness.  No cyanosis, clubbing, or edema.  2+ distal pulses. NEUROLOGIC: Alert and oriented to person, place, and time. Normal reflexes, muscle tone coordination. No cranial nerve deficit noted. PSYCHIATRIC: Normal mood and affect. Normal behavior. Normal judgment and thought content. CARDIOVASCULAR: Normal heart rate noted, regular rhythm RESPIRATORY: Clear to auscultation bilaterally. Effort and breath sounds normal, no problems with respiration noted. BREASTS: Symmetric in size. No masses, skin changes, nipple drainage, or  lymphadenopathy. ABDOMEN: Soft, normal bowel sounds, no distention noted.  No tenderness, rebound or guarding.  PELVIC: Normal appearing external genitalia; normal appearing vaginal mucosa and cervix.  No abnormal discharge noted.  Pap smear obtained.  Normal uterine size, no other palpable masses, no uterine tenderness. Right adnexa tenderness with palpation, no palpable masses or cyst.    Assessment and Plan:    1. Pelvic pain - Pelvic pain has been occurring since last child in 2014- has not had insurance to be evaluated  - Reports pain during intercourse that is shooting pain to right ovary then cramping  - Reports painful periods for the 1st day of cycle, reports being bent over in ball first day in addition to PMDD week prior to cycle. - Educated and discussed options for pelvic pain including [place], birth control and scheduled medication  - patient does not want to start on contraception prior to [place] - Encouraged to start taking ibuprofen 2 days prior to period and continue until day 2 of cycle. Will obtain US to assess for cyst vs endometriosis - US Pelvis Complete; Future  2. Women's annual routine gynecological examination - Cytology - PAP( [place])  Will follow up results of pap smear and manage accordingly. Follow up after [place] to assess pelvic pain  Routine preventative health maintenance measures emphasized. Please refer to After Visit Summary for other counseling recommendations.      Lajean Manes, [place] for [employer], [place]

## 2018-09-15 NOTE — Patient Instructions (Signed)
Endometriosis  Endometriosis is a condition in which the tissue that lines the uterus (endometrium) grows outside of its normal location. The tissue may grow in many locations close to the uterus, but it commonly grows on the ovaries, fallopian tubes, vagina, or bowel. When the uterus sheds the endometrium every menstrual cycle, there is bleeding wherever the endometrial tissue is located. This can cause pain because blood is irritating to tissues that are not normally exposed to it. What are the causes? The cause of endometriosis is not known. What increases the risk? You may be more likely to develop endometriosis if you:  Have a family history of endometriosis.  Have never given birth.  Started your period at age 10 or younger.  Have high levels of estrogen in your body.  Were exposed to a certain medicine (diethylstilbestrol) before you were born (in utero).  Had low birth weight.  Were born as a twin, triplet, or other multiple.  Have a BMI of less than 25. BMI is an estimate of body fat and is calculated from height and weight. What are the signs or symptoms? Often, there are no symptoms of this condition. If you do have symptoms, they may:  Vary depending on where your endometrial tissue is growing.  Occur during your menstrual period (most common) or midcycle.  Come and go, or you may go months with no symptoms at all.  Stop with menopause. Symptoms may include:  Pain in the back or abdomen.  Heavier bleeding during periods.  Pain during sex.  Painful bowel movements.  Infertility.  Pelvic pain.  Bleeding more than once a month. How is this diagnosed? This condition is diagnosed based on your symptoms and a physical exam. You may have tests, such as:  Blood tests and urine tests. These may be done to help rule out other possible causes of your symptoms.  Ultrasound, to look for abnormal tissues.  An X-ray of the lower bowel (barium enema).  An  ultrasound that is done through the vagina (transvaginally).  CT scan.  MRI.  Laparoscopy. In this procedure, a lighted, pencil-sized instrument called a laparoscope is inserted into your abdomen through an incision. The laparoscope allows your health care provider to look at the organs inside your body and check for abnormal tissue to confirm the diagnosis. If abnormal tissue is found, your health care provider may remove a small piece of tissue (biopsy) to be examined under a microscope. How is this treated? Treatment for this condition may include:  Medicines to relieve pain, such as NSAIDs.  Hormone therapy. This involves using artificial (synthetic) hormones to reduce endometrial tissue growth. Your health care provider may recommend using a hormonal form of birth control, or other medicines.  Surgery. This may be done to remove abnormal endometrial tissue. ? In some cases, tissue may be removed using a laparoscope and a laser (laparoscopic laser treatment). ? In severe cases, surgery may be done to remove the fallopian tubes, uterus, and ovaries (hysterectomy). Follow these instructions at home:  Take over-the-counter and prescription medicines only as told by your health care provider.  Do not drive or use heavy machinery while taking prescription pain medicine.  Try to avoid activities that cause pain, including sexual activity.  Keep all follow-up visits as told by your health care provider. This is important. Contact a health care provider if:  You have pain in the area between your hip bones (pelvic area) that occurs: ? Before, during, or after your period. ?   In between your period and gets worse during your period. ? During or after sex. ? With bowel movements or urination, especially during your period.  You have problems getting pregnant.  You have a fever. Get help right away if:  You have severe pain that does not get better with medicine.  You have severe  nausea and vomiting, or you cannot eat without vomiting.  You have pain that affects only the lower, right side of your abdomen.  You have abdominal pain that gets worse.  You have abdominal swelling.  You have blood in your stool. This information is not intended to replace advice given to you by your health care provider. Make sure you discuss any questions you have with your health care provider. Document Released: 02/23/2000 Document Revised: 02/07/2017 Document Reviewed: 07/29/2015 Elsevier Patient Education  2020 [place].  Pelvic Pain, Female Pelvic pain is pain in your lower belly (abdomen), below your belly button and between your hips. The pain may start suddenly (be acute), keep coming back (be recurring), or last a long time (become chronic). Pelvic pain that lasts longer than 6 months is called chronic pelvic pain. There are many causes of pelvic pain. Sometimes the cause of pelvic pain is not known. Follow these instructions at home:   Take over-the-counter and prescription medicines only as told by your doctor.  Rest as told by your doctor.  Do not have sex if it hurts.  Keep a journal of your pelvic pain. Write down: ? When the pain started. ? Where the pain is located. ? What seems to make the pain better or worse, such as food or your period (menstrual cycle). ? Any symptoms you have along with the pain.  Keep all follow-up visits as told by your doctor. This is important. Contact a doctor if:  Medicine does not help your pain.  Your pain comes back.  You have new symptoms.  You have unusual discharge or bleeding from your vagina.  You have a fever or chills.  You are having trouble pooping (constipation).  You have blood in your pee (urine) or poop (stool).  Your pee smells bad.  You feel weak or light-headed. Get help right away if:  You have sudden pain that is very bad.  Your pain keeps getting worse.  You have very bad pain and also  have any of these symptoms: ? A fever. ? Feeling sick to your stomach (nausea). ? Throwing up (vomiting). ? Being very sweaty.  You pass out (lose consciousness). Summary  Pelvic pain is pain in your lower belly (abdomen), below your belly button and between your hips.  There are many possible causes of pelvic pain.  Keep a journal of your pelvic pain. This information is not intended to replace advice given to you by your health care provider. Make sure you discuss any questions you have with your health care provider. Document Released: 08/14/2007 Document Revised: 08/13/2017 Document Reviewed: 08/13/2017 Elsevier Patient Education  2020 [employer].

## 2018-09-15 NOTE — Progress Notes (Signed)
Pt c/o painful periods

## 2018-09-17 NOTE — Addendum Note (Signed)
Addended by: Asencion Islam on: 09/17/2018 11:20 AM   Modules accepted: Orders

## 2018-09-18 LAB — CYTOLOGY - PAP
Chlamydia: NEGATIVE
Diagnosis: NEGATIVE
HPV: NOT DETECTED
Neisseria Gonorrhea: NEGATIVE

## 2018-10-02 ENCOUNTER — Other Ambulatory Visit: Payer: Self-pay
